# Patient Record
Sex: Female | Born: 1955 | Race: White | Hispanic: No | Smoking: Former smoker
Health system: Southern US, Community
[De-identification: ages and names within clinical notes are randomized; demographics above are authoritative.]

## PROBLEM LIST (undated history)

## (undated) DIAGNOSIS — J449 Chronic obstructive pulmonary disease, unspecified: Secondary | ICD-10-CM

## (undated) DIAGNOSIS — I10 Essential (primary) hypertension: Secondary | ICD-10-CM

## (undated) DIAGNOSIS — I509 Heart failure, unspecified: Secondary | ICD-10-CM

## (undated) DIAGNOSIS — E119 Type 2 diabetes mellitus without complications: Secondary | ICD-10-CM

## (undated) DIAGNOSIS — F32A Depression, unspecified: Secondary | ICD-10-CM

## (undated) HISTORY — DX: Chronic obstructive pulmonary disease, unspecified: J44.9

## (undated) HISTORY — DX: Essential (primary) hypertension: I10

## (undated) HISTORY — DX: Depression, unspecified: F32.A

## (undated) HISTORY — DX: Type 2 diabetes mellitus without complications: E11.9

## (undated) HISTORY — DX: Heart failure, unspecified: I50.9

---

## 2014-05-15 ENCOUNTER — Emergency Department: Payer: Self-pay | Admitting: Student

## 2015-03-16 ENCOUNTER — Ambulatory Visit: Payer: Self-pay

## 2015-03-23 ENCOUNTER — Ambulatory Visit: Payer: Self-pay | Attending: Oncology | Admitting: *Deleted

## 2015-03-23 ENCOUNTER — Other Ambulatory Visit: Payer: Self-pay | Admitting: Oncology

## 2015-03-23 ENCOUNTER — Encounter: Payer: Self-pay | Admitting: *Deleted

## 2015-03-23 ENCOUNTER — Ambulatory Visit
Admission: RE | Admit: 2015-03-23 | Discharge: 2015-03-23 | Disposition: A | Discharge status: Home / Self Care | Payer: Self-pay | Source: Ambulatory Visit | Attending: Oncology | Admitting: Oncology

## 2015-03-23 VITALS — BP 141/80 | HR 75 | Temp 98.3°F | Resp 18 | Ht 64.17 in | Wt 278.2 lb

## 2015-03-23 DIAGNOSIS — Z0000 Encounter for general adult medical examination without abnormal findings: Secondary | ICD-10-CM

## 2015-03-23 NOTE — Patient Instructions (Signed)
Gave patient hand-out, Women Staying Healthy, Active and Well from [facility], with education on breast health, pap smears, heart and colon health. 

## 2015-03-23 NOTE — Progress Notes (Signed)
Subjective:     Patient ID: Kristie CoveLinda Lou Sanchez, female   DOB: September 22, 1955, 60 y.o.   MRN: [id number]  HPI   Review of Systems     Objective:   Physical Exam  Pulmonary/Chest: Right breast exhibits no inverted nipple, no mass, no nipple discharge, no skin change and no tenderness. Left breast exhibits no inverted nipple, no mass, no nipple discharge, no skin change and no tenderness. Breasts are symmetrical.  Abdominal: There is no splenomegaly or hepatomegaly.  Genitourinary: Rectal exam shows no mass. No labial fusion. There is no rash, tenderness, lesion or injury on the right labia. There is no rash, tenderness, lesion or injury on the left labia. Uterus is not deviated, not enlarged, not fixed and not tender. Cervix exhibits no motion tenderness, no discharge and no friability. Right adnexum displays no mass and no fullness. Left adnexum displays no mass, no tenderness and no fullness. No erythema, tenderness or bleeding in the vagina. No foreign body around the vagina. No signs of injury around the vagina. No vaginal discharge found.       Assessment:     60 year [facility] White [facility] pap and mammogram.  Clinical breast exam unremarkable.  Taught self breast awareness.  Specimen collected for pap smear without difficulty.  Patient has been screened for eligibility.  She does not have any insurance, Medicare or Medicaid.  She also meets financial eligibility.  Hand-out given on the Affordable Care Act.    Plan:     Screening mammogram ordered.  Pap specimen sent to the lab.  Will follow-up per protocol.

## 2015-03-28 ENCOUNTER — Encounter: Payer: Self-pay | Admitting: *Deleted

## 2015-03-31 LAB — PAP LB AND HPV HIGH-RISK
HPV, HIGH-RISK: NEGATIVE
PAP Smear Comment: 0

## 2015-04-05 ENCOUNTER — Encounter: Payer: Self-pay | Admitting: *Deleted

## 2015-04-05 NOTE — Progress Notes (Signed)
Letter mailed to inform patient of her normal mammogram and pap smear.  Next pap due in 5 years, and mammogram in 1 year.  HSIS to [facility].

## 2017-07-31 ENCOUNTER — Ambulatory Visit: Payer: Self-pay | Attending: Oncology

## 2018-05-05 ENCOUNTER — Ambulatory Visit: Payer: Self-pay | Attending: Oncology | Admitting: *Deleted

## 2018-05-05 ENCOUNTER — Ambulatory Visit
Admission: RE | Admit: 2018-05-05 | Discharge: 2018-05-05 | Disposition: A | Discharge status: Home / Self Care | Payer: Self-pay | Source: Ambulatory Visit | Attending: Oncology | Admitting: Oncology

## 2018-05-05 ENCOUNTER — Encounter: Payer: Self-pay | Admitting: *Deleted

## 2018-05-05 VITALS — BP 136/78 | HR 75 | Temp 98.3°F | Ht 64.0 in | Wt 292.6 lb

## 2018-05-05 DIAGNOSIS — Z0000 Encounter for general adult medical examination without abnormal findings: Secondary | ICD-10-CM | POA: Insufficient documentation

## 2018-05-05 NOTE — Patient Instructions (Signed)
Gave patient hand-out, Women Staying Healthy, Active and Well from [facility], with education on breast health, pap smears, heart and colon health. 

## 2018-05-05 NOTE — Progress Notes (Signed)
  Subjective:     Patient ID: [name], female   DOB: 09-26-1955, 63 y.o.   MRN: [id number]  HPI   Review of Systems     Objective:   Physical Exam Chest:     Breasts:        Right: No swelling, bleeding, inverted nipple, mass, nipple discharge, skin change or tenderness.        Left: No swelling, bleeding, inverted nipple, mass, nipple discharge, skin change or tenderness.  Lymphadenopathy:     Upper Body:     Right upper body: No supraclavicular or axillary adenopathy.     Left upper body: No supraclavicular or axillary adenopathy.        Assessment:     63 year old White female returns to [facility] for annual screening.  Clinical breast exam unremarkable.  Taught self breast awareness.  Last pap on 03/31/15 was negative / negative.  Next pap due in 2022.  Patient has been screened for eligibility.  She does not have any insurance, Medicare or Medicaid.  She also meets financial eligibility.  Hand-out given on the Affordable Care Act.  Risk Assessment    Risk Scores      05/05/2018   Last edited by: Alta Corning, CMA   5-year risk: 1.1 %   Lifetime risk: 4.9 %            Plan:     Screening mammogram ordered.  Will follow-up per [facility] protocol.

## 2018-05-07 ENCOUNTER — Encounter: Payer: Self-pay | Admitting: *Deleted

## 2018-05-07 NOTE — Progress Notes (Signed)
Letter mailed from [facility] to inform patient of her normal mammogram results.  Patient is to follow-up with annual screening in one year.  HSIS to [facility]. 

## 2019-10-20 IMAGING — MG DIGITAL SCREENING BILATERAL MAMMOGRAM WITH TOMO AND CAD
5 series · 6 of 9 positions shown · non-contrast
Comparison: Previous exam(s).

ACR Breast Density Category a: The breast tissue is almost entirely
fatty.

CLINICAL DATA: Screening.

EXAM:
DIGITAL SCREENING BILATERAL MAMMOGRAM WITH TOMO AND CAD

[L CC synth-2D]
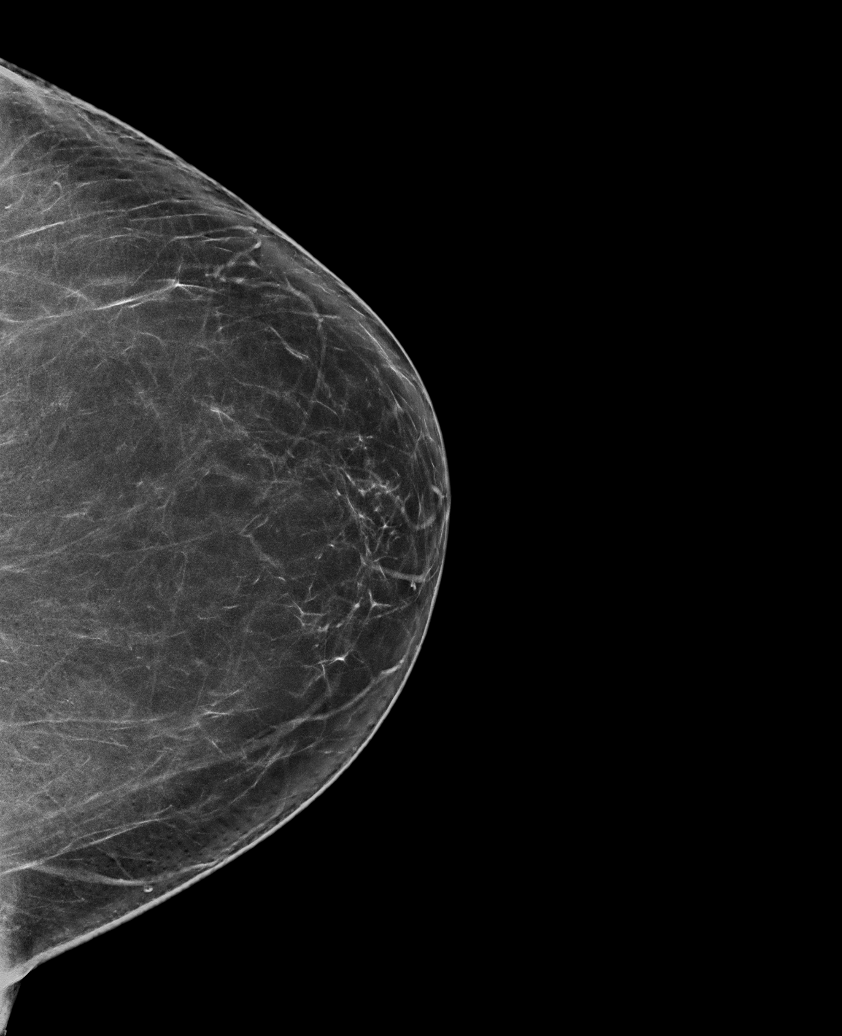

[R CC synth-2D]
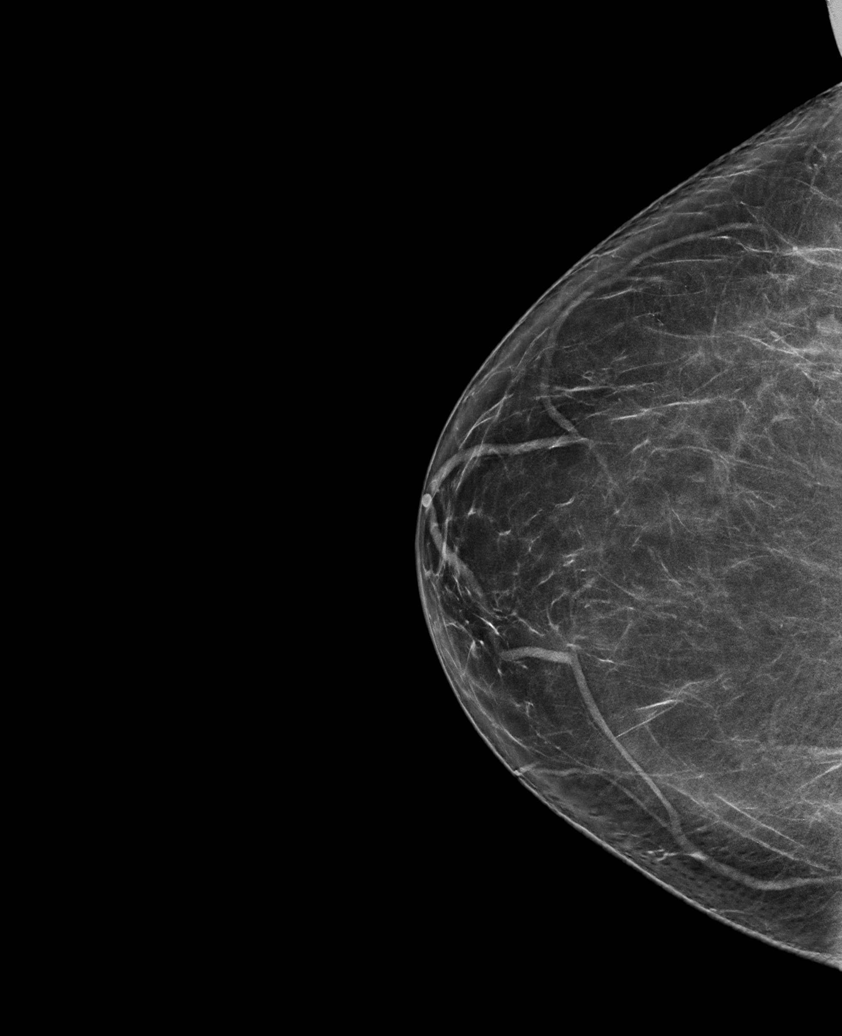

[R MLO synth-2D]
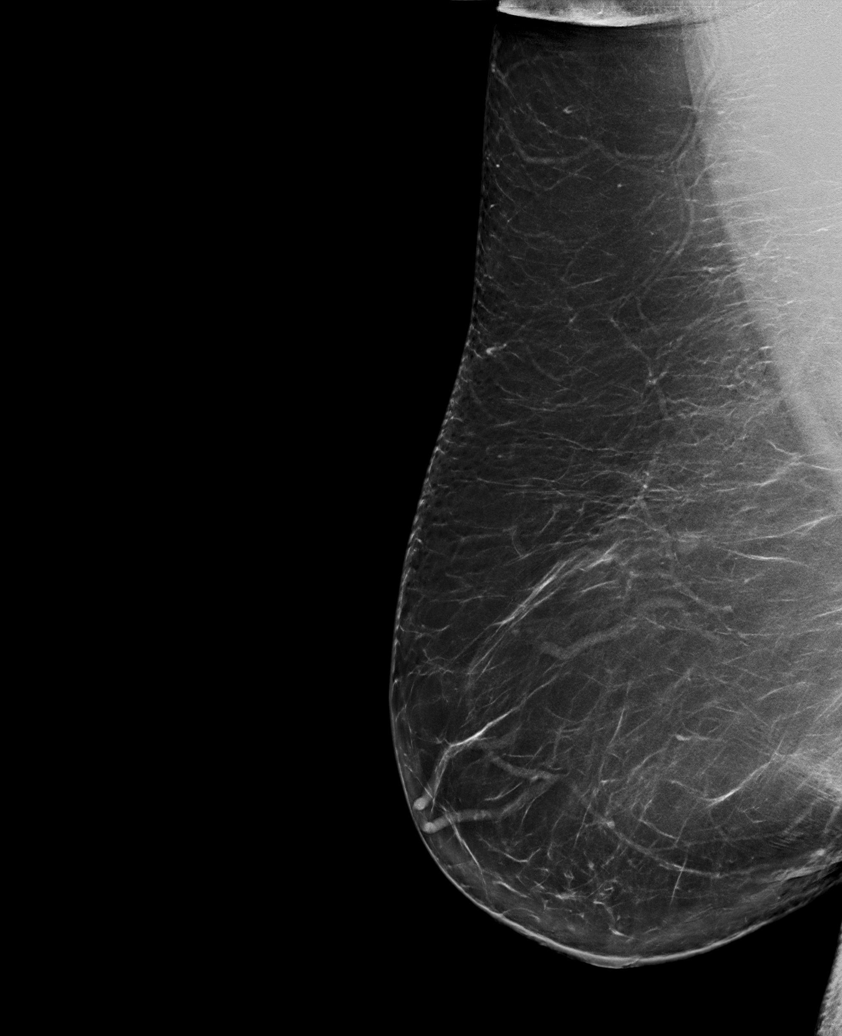

[L MLO synth-2D]
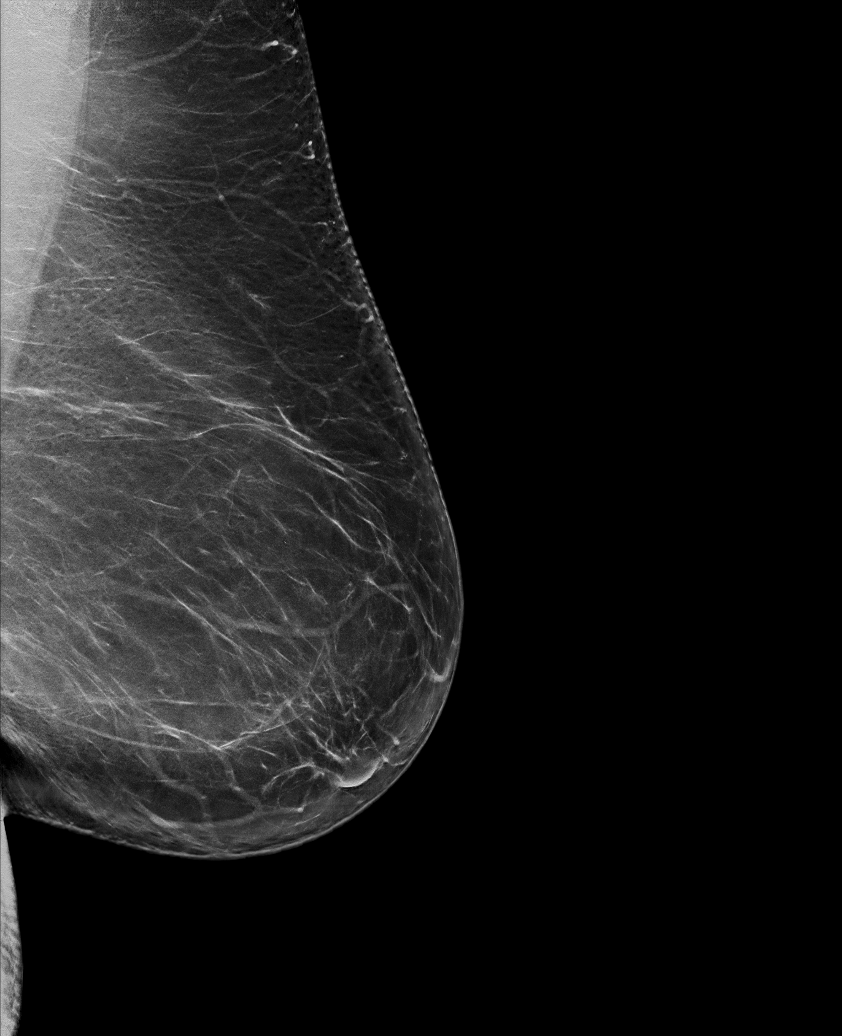

[R CC tomo · 2 of 72 frames shown]
[frame 24/72]
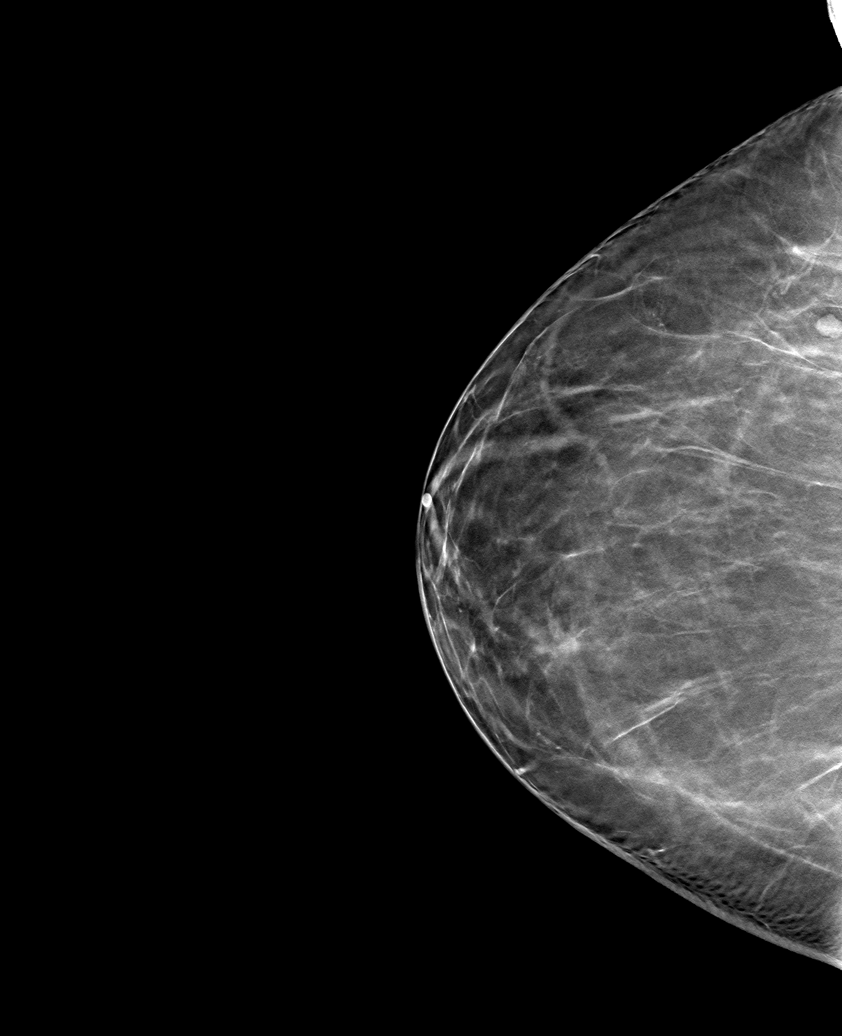
[frame 37/72]
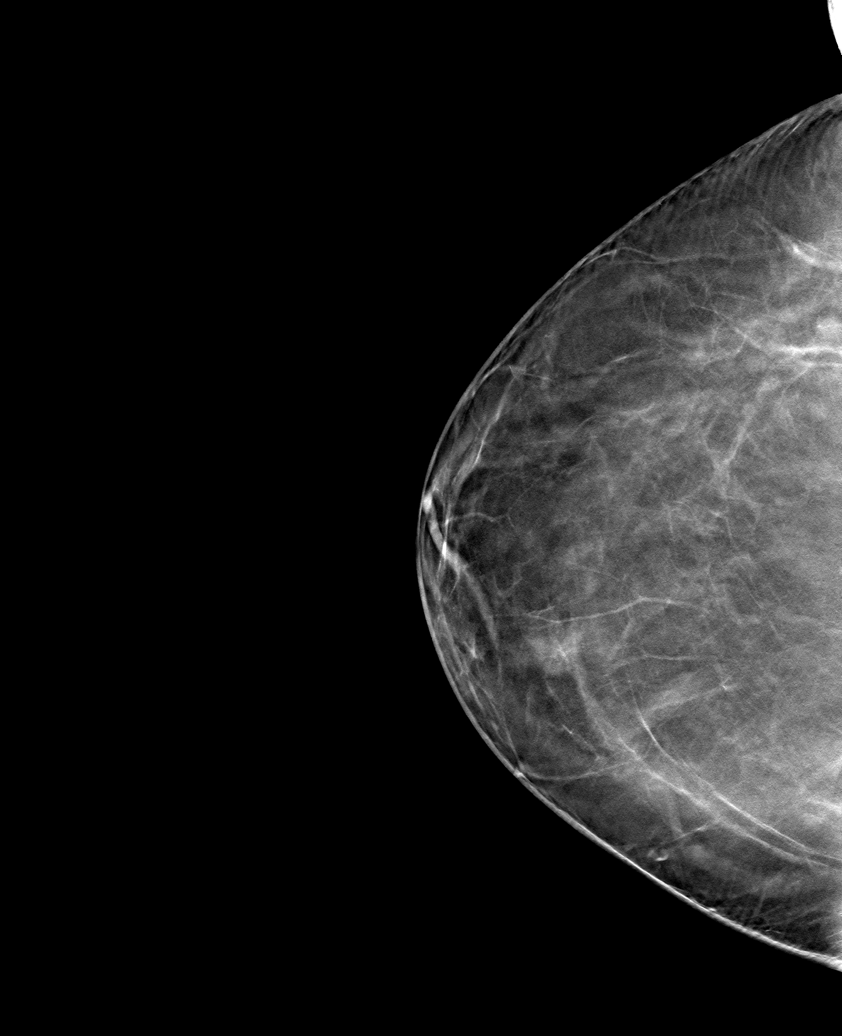

[6 of 9 positions shown; findings below may reference images not displayed]

FINDINGS: There are no findings suspicious for malignancy. Images were
processed with CAD.
IMPRESSION: No mammographic evidence of malignancy. A result letter of this
screening mammogram will be mailed directly to the patient.

RECOMMENDATION:
Screening mammogram in one year. (Code:[id number]-[id number])

BI-RADS CATEGORY  1: Negative.

## 2021-07-19 ENCOUNTER — Other Ambulatory Visit: Payer: Self-pay | Admitting: Family Medicine

## 2021-07-19 DIAGNOSIS — Z1231 Encounter for screening mammogram for malignant neoplasm of breast: Secondary | ICD-10-CM

## 2022-02-10 ENCOUNTER — Inpatient Hospital Stay
Admission: EM | Admit: 2022-02-10 | Discharge: 2022-02-12 | DRG: 291 | Disposition: A | Discharge status: Home / Self Care | Payer: Medicare Other | Attending: Internal Medicine | Admitting: Internal Medicine

## 2022-02-10 ENCOUNTER — Emergency Department: Payer: Medicare Other

## 2022-02-10 ENCOUNTER — Other Ambulatory Visit: Payer: Self-pay

## 2022-02-10 DIAGNOSIS — J209 Acute bronchitis, unspecified: Secondary | ICD-10-CM | POA: Diagnosis present

## 2022-02-10 DIAGNOSIS — Z87891 Personal history of nicotine dependence: Secondary | ICD-10-CM | POA: Diagnosis not present

## 2022-02-10 DIAGNOSIS — Z79899 Other long term (current) drug therapy: Secondary | ICD-10-CM

## 2022-02-10 DIAGNOSIS — I509 Heart failure, unspecified: Secondary | ICD-10-CM | POA: Diagnosis not present

## 2022-02-10 DIAGNOSIS — Z20822 Contact with and (suspected) exposure to covid-19: Secondary | ICD-10-CM | POA: Diagnosis present

## 2022-02-10 DIAGNOSIS — J9601 Acute respiratory failure with hypoxia: Secondary | ICD-10-CM | POA: Diagnosis present

## 2022-02-10 DIAGNOSIS — I5031 Acute diastolic (congestive) heart failure: Secondary | ICD-10-CM | POA: Diagnosis present

## 2022-02-10 DIAGNOSIS — I110 Hypertensive heart disease with heart failure: Principal | ICD-10-CM | POA: Diagnosis present

## 2022-02-10 DIAGNOSIS — E1165 Type 2 diabetes mellitus with hyperglycemia: Secondary | ICD-10-CM | POA: Diagnosis present

## 2022-02-10 DIAGNOSIS — Z882 Allergy status to sulfonamides status: Secondary | ICD-10-CM | POA: Diagnosis not present

## 2022-02-10 DIAGNOSIS — Z6841 Body Mass Index (BMI) 40.0 and over, adult: Secondary | ICD-10-CM

## 2022-02-10 DIAGNOSIS — I10 Essential (primary) hypertension: Secondary | ICD-10-CM | POA: Insufficient documentation

## 2022-02-10 DIAGNOSIS — R0602 Shortness of breath: Secondary | ICD-10-CM

## 2022-02-10 DIAGNOSIS — E119 Type 2 diabetes mellitus without complications: Secondary | ICD-10-CM

## 2022-02-10 DIAGNOSIS — F32A Depression, unspecified: Secondary | ICD-10-CM | POA: Insufficient documentation

## 2022-02-10 DIAGNOSIS — F17200 Nicotine dependence, unspecified, uncomplicated: Secondary | ICD-10-CM | POA: Insufficient documentation

## 2022-02-10 DIAGNOSIS — J441 Chronic obstructive pulmonary disease with (acute) exacerbation: Secondary | ICD-10-CM | POA: Diagnosis present

## 2022-02-10 DIAGNOSIS — R0902 Hypoxemia: Principal | ICD-10-CM

## 2022-02-10 DIAGNOSIS — E66813 Obesity, class 3: Secondary | ICD-10-CM | POA: Insufficient documentation

## 2022-02-10 LAB — RESP PANEL BY RT-PCR (RSV, FLU A&B, COVID)  RVPGX2
Influenza A by PCR: NEGATIVE
Influenza B by PCR: NEGATIVE
Resp Syncytial Virus by PCR: NEGATIVE
SARS Coronavirus 2 by RT PCR: NEGATIVE

## 2022-02-10 LAB — BRAIN NATRIURETIC PEPTIDE: B Natriuretic Peptide: 49.8 pg/mL (ref 0.0–100.0)

## 2022-02-10 LAB — CBC WITH DIFFERENTIAL/PLATELET
Abs Immature Granulocytes: 0.06 10*3/uL (ref 0.00–0.07)
Basophils Absolute: 0 10*3/uL (ref 0.0–0.1)
Basophils Relative: 0 %
Eosinophils Absolute: 0.1 10*3/uL (ref 0.0–0.5)
Eosinophils Relative: 1 %
HCT: 42.8 % (ref 36.0–46.0)
Hemoglobin: 13.2 g/dL (ref 12.0–15.0)
Immature Granulocytes: 1 %
Lymphocytes Relative: 16 %
Lymphs Abs: 1.8 10*3/uL (ref 0.7–4.0)
MCH: 28.1 pg (ref 26.0–34.0)
MCHC: 30.8 g/dL (ref 30.0–36.0)
MCV: 91.3 fL (ref 80.0–100.0)
Monocytes Absolute: 0.7 10*3/uL (ref 0.1–1.0)
Monocytes Relative: 7 %
Neutro Abs: 8.7 10*3/uL — ABNORMAL HIGH (ref 1.7–7.7)
Neutrophils Relative %: 75 %
Platelets: 187 10*3/uL (ref 150–400)
RBC: 4.69 MIL/uL (ref 3.87–5.11)
RDW: 14.3 % (ref 11.5–15.5)
WBC: 11.4 10*3/uL — ABNORMAL HIGH (ref 4.0–10.5)
nRBC: 0 % (ref 0.0–0.2)

## 2022-02-10 LAB — COMPREHENSIVE METABOLIC PANEL
ALT: 17 U/L (ref 0–44)
AST: 14 U/L — ABNORMAL LOW (ref 15–41)
Albumin: 3.3 g/dL — ABNORMAL LOW (ref 3.5–5.0)
Alkaline Phosphatase: 62 U/L (ref 38–126)
Anion gap: 6 (ref 5–15)
BUN: 10 mg/dL (ref 8–23)
CO2: 33 mmol/L — ABNORMAL HIGH (ref 22–32)
Calcium: 9.1 mg/dL (ref 8.9–10.3)
Chloride: 102 mmol/L (ref 98–111)
Creatinine, Ser: 0.66 mg/dL (ref 0.44–1.00)
GFR, Estimated: >60 mL/min (ref >60)
Glucose, Bld: 170 mg/dL — ABNORMAL HIGH (ref 70–99)
Potassium: 3.9 mmol/L (ref 3.5–5.1)
Sodium: 141 mmol/L (ref 135–145)
Total Bilirubin: 0.6 mg/dL (ref 0.3–1.2)
Total Protein: 7.4 g/dL (ref 6.5–8.1)

## 2022-02-10 LAB — PHOSPHORUS: Phosphorus: 3.1 mg/dL (ref 2.5–4.6)

## 2022-02-10 LAB — PROCALCITONIN: Procalcitonin: <0.1 ng/mL

## 2022-02-10 LAB — MAGNESIUM: Magnesium: 2.2 mg/dL (ref 1.7–2.4)

## 2022-02-10 MED ORDER — VENLAFAXINE HCL ER 75 MG PO CP24
150.0000 mg | ORAL_CAPSULE | Freq: Every day | ORAL | Status: DC
  Administered 2022-02-11 – 2022-02-12 (×2): 150 mg via ORAL
  Filled 2022-02-10: qty 1
  Filled 2022-02-10: qty 2
  Filled 2022-02-10: qty 1

## 2022-02-10 MED ORDER — ACETAMINOPHEN 650 MG RE SUPP: 650.0000 mg | Freq: Four times a day (QID) | RECTAL | Status: DC | PRN

## 2022-02-10 MED ORDER — SENNOSIDES-DOCUSATE SODIUM 8.6-50 MG PO TABS: 1.0000 | ORAL_TABLET | Freq: Every evening | ORAL | Status: DC | PRN

## 2022-02-10 MED ORDER — ENOXAPARIN SODIUM 40 MG/0.4ML IJ SOSY: 40.0000 mg | PREFILLED_SYRINGE | INTRAMUSCULAR | Status: DC

## 2022-02-10 MED ORDER — ONDANSETRON HCL 4 MG PO TABS: 4.0000 mg | ORAL_TABLET | Freq: Four times a day (QID) | ORAL | Status: DC | PRN

## 2022-02-10 MED ORDER — LISINOPRIL 5 MG PO TABS
5.0000 mg | ORAL_TABLET | Freq: Two times a day (BID) | ORAL | Status: DC
  Administered 2022-02-10 – 2022-02-12 (×4): 5 mg via ORAL
  Filled 2022-02-10 (×4): qty 1

## 2022-02-10 MED ORDER — ACETAMINOPHEN 325 MG PO TABS: 650.0000 mg | ORAL_TABLET | Freq: Four times a day (QID) | ORAL | Status: DC | PRN

## 2022-02-10 MED ORDER — HYDROCHLOROTHIAZIDE 12.5 MG PO TABS
12.5000 mg | ORAL_TABLET | Freq: Every day | ORAL | Status: DC
  Filled 2022-02-10: qty 1

## 2022-02-10 MED ORDER — NICOTINE 21 MG/24HR TD PT24: 21.0000 mg | MEDICATED_PATCH | Freq: Every day | TRANSDERMAL | Status: DC | PRN

## 2022-02-10 MED ORDER — LABETALOL HCL 5 MG/ML IV SOLN: 5.0000 mg | INTRAVENOUS | Status: DC | PRN

## 2022-02-10 MED ORDER — ONDANSETRON HCL 4 MG/2ML IJ SOLN: 4.0000 mg | Freq: Four times a day (QID) | INTRAMUSCULAR | Status: DC | PRN

## 2022-02-10 MED ORDER — FUROSEMIDE 10 MG/ML IJ SOLN
20.0000 mg | Freq: Once | INTRAMUSCULAR | Status: AC
  Administered 2022-02-10: 20 mg via INTRAVENOUS
  Filled 2022-02-10: qty 4

## 2022-02-10 MED ORDER — FUROSEMIDE 10 MG/ML IJ SOLN
20.0000 mg | Freq: Two times a day (BID) | INTRAMUSCULAR | Status: DC
  Administered 2022-02-11: 20 mg via INTRAVENOUS
  Filled 2022-02-10: qty 4

## 2022-02-10 MED ORDER — ENOXAPARIN SODIUM 80 MG/0.8ML IJ SOSY
0.5000 mg/kg | PREFILLED_SYRINGE | INTRAMUSCULAR | Status: DC
  Administered 2022-02-10 – 2022-02-11 (×2): 65 mg via SUBCUTANEOUS
  Filled 2022-02-10: qty 0.65
  Filled 2022-02-10: qty 0.8
  Filled 2022-02-10: qty 0.65

## 2022-02-10 NOTE — ED Notes (Signed)
Pt to ED for SOB since 1 week. Pitting edema noted to both ankles. Currently on 2L oxygen, which was placed in triage. Respirations appear unlabored.

## 2022-02-10 NOTE — Assessment & Plan Note (Addendum)
-   Resumed home lisinopril 5 mg in the morning and in the afternoon, hydrochlorothiazide 12.5 mg daily - Labetalol 5 mg IV every 3 hours as needed for SBP greater than 175, 4 doses ordered

## 2022-02-10 NOTE — Hospital Course (Signed)
[name] is a 66 year old female with obesity, hypertension, nicotine use, who presents emergency department for chief concerns of 1 week of worsening shortness of breath.  Initial vitals in the emergency department showed temperature 98.3, respiration rate of 22, heart rate of 95, blood pressure 155/79, SpO2 of 90% on room air.  Serum sodium is 141, potassium 3.9, chloride 102, bicarb 33, BUN of 10, serum creatinine of 0.66, EGFR greater than 60, nonfasting blood glucose 170, WBC 11.4, hemoglobin 13.2, platelets of 187.  BNP is 49.8.  COVID/influenza A/influenza B PCR were negative.  RSV was negative.  Per ED report, at outpatient urgent care center at patient's primary care office, patient had SpO2 of 89% on room air and patient was advised to come to the emergency department for further evaluation.  ED treatment: Furosemide 20 mg IV one-time dose.

## 2022-02-10 NOTE — Assessment & Plan Note (Signed)
-   Resumed home venlafaxine 150 mg daily with breakfast

## 2022-02-10 NOTE — ED Triage Notes (Signed)
Pt presents to the ED via POV due to SOB. Pt has been feeling well x1 week. Pt was seen at that doctor's office earlier today and told to come to ER due to low O2 levels 89%. Pt is 90% on RA. Pt placed on 2L in triage. Husband bedside. Pt A&Ox4. Pt does not appear in distress at this moment.

## 2022-02-10 NOTE — Assessment & Plan Note (Addendum)
Worsening dyspnea especially with exertion - Check procalcitonin - Strict I's and O's - Complete echo ordered - Patient is status post furosemide 20 mg IV per EDP - Furosemide 20 mg IV twice daily starting on 02/11/2022, 2 doses ordered - Telemetry cardiac, inpatient

## 2022-02-10 NOTE — ED Provider Triage Note (Signed)
Emergency Medicine Provider Triage Evaluation Note  Kristie Sanchez , a 66 y.o. female  was evaluated in triage.  Pt complains of difficulty breathing, cough for over a week, complaining of some upper back pain.  Review of Systems  Positive:  Negative:   Physical Exam  BP (!) 155/79 (BP Location: Left Wrist)   Pulse 95   Temp 98.9 F (37.2 C) (Oral)   Resp (!) 22   SpO2 90%  Gen:   Awake, no distress   Resp:  Normal effort  MSK:   Moves extremities without difficulty  Other:    Medical Decision Making  Medically screening exam initiated at 11:40 AM.  Appropriate orders placed.  Kristie Sanchez was informed that the remainder of the evaluation will be completed by another provider, this initial triage assessment does not replace that evaluation, and the importance of remaining in the ED until their evaluation is complete.     Faythe Ghee, PA-C 02/10/22 1140

## 2022-02-10 NOTE — Progress Notes (Signed)
PHARMACIST - PHYSICIAN COMMUNICATION  CONCERNING:  Enoxaparin (Lovenox) for DVT Prophylaxis    RECOMMENDATION: Patient was prescribed enoxaprin 40mg  q24 hours for VTE prophylaxis.   Filed Weights   02/10/22 1700 02/10/22 1702  Weight: (!) 136.5 kg (300 lb 14.9 oz) 131.5 kg (289 lb 12.8 oz)    Body mass index is 51.34 kg/m.  Estimated Creatinine Clearance: 90.5 mL/min (by C-G formula based on SCr of 0.66 mg/dL).   Based on [facility] policy patient is candidate for enoxaparin 0.5mg /kg TBW SQ every 24 hours based on BMI being >30.  DESCRIPTION: Pharmacy has adjusted enoxaparin dose per [facility] policy.  Patient is now receiving enoxaparin 65 mg every 24 hours    [facility], PharmD Clinical Pharmacist  02/10/2022 5:06 PM

## 2022-02-10 NOTE — ED Notes (Signed)
Called lab. They will run new labs except HIV off previous specimen.

## 2022-02-10 NOTE — Assessment & Plan Note (Addendum)
-   This complicates overall care and prognosis.  - Counseled patient on healthy weight loss

## 2022-02-10 NOTE — H&P (Signed)
History and Physical   Kristie Sanchez NUU:[id number] DOB: April 09, 1955 DOA: 02/10/2022  PCP: Gennette Pac, FNP  Patient coming from: [facility] via [place]  I have personally briefly reviewed patient's old medical records in [place].  Chief Concern: Shortness of breath  HPI: [name] is a 66 year old female with obesity, hypertension, nicotine use, who presents emergency department for chief concerns of 1 week of worsening shortness of breath.  Initial vitals in the emergency department showed temperature 98.3, respiration rate of 22, heart rate of 95, blood pressure 155/79, SpO2 of 90% on room air.  Serum sodium is 141, potassium 3.9, chloride 102, bicarb 33, BUN of 10, serum creatinine of 0.66, EGFR greater than 60, nonfasting blood glucose 170, WBC 11.4, hemoglobin 13.2, platelets of 187.  BNP is 49.8.  COVID/influenza A/influenza B PCR were negative.  RSV was negative.  Per ED report, at outpatient urgent care center at patient's primary care office, patient had SpO2 of 89% on room air and patient was advised to come to the emergency department for further evaluation.  ED treatment: Furosemide 20 mg IV one-time dose.  At bedside, she is able to tell me her name, age, current year.  She reports shortness of breath that is worse with exertion for over 6 months. She states that the symptoms have gotten worse over the last week. She has had to increase the incline of her bed at home. She further reports increase productive cough over the past one week.  She reports that there is increased sputum production and it is dark thick brown.  She denies fever, trauma to her person, chest pain, abdominal pain, dysuria, hematuria.  She endorses 2 episodes of loose bowel movements on 02/09/2022.  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  Social history: She lives at home with her son, Kristie Sanchez. She vapes nicotine daily. She denies etoh  and recreational drug use. 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  ROS: Constitutional: no weight change, no fever ENT/Mouth: no sore throat, no rhinorrhea Eyes: no eye pain, no vision changes Cardiovascular: no chest pain, + dyspnea,  no edema, no palpitations Respiratory: + cough, no sputum, no wheezing Gastrointestinal: no nausea, no vomiting, no diarrhea, no constipation Genitourinary: no urinary incontinence, no dysuria, no hematuria Musculoskeletal: no arthralgias, no myalgias Skin: no skin lesions, no pruritus, Neuro: + weakness, no loss of consciousness, no syncope Psych: no anxiety, no depression, + decrease appetite Heme/Lymph: no bruising, no bleeding  ED Course: Discussed with emergency medicine provider, patient requiring hospitalization for chief concerns of new onset heart failure.  Assessment/Plan  Principal Problem:   New onset of congestive heart failure (HCC) Active Problems:   Essential hypertension   Depression   Nicotine dependence   Morbid obesity with body mass index (BMI) of 50.0 to 59.9 in adult [facility])   Assessment and Plan:  * New onset of congestive heart failure (HCC) Worsening dyspnea especially with exertion - Check procalcitonin - Strict I's and O's - Complete echo ordered - Patient is status post furosemide 20 mg IV per EDP - Furosemide 20 mg IV twice daily starting on 02/11/2022, 2 doses ordered - Telemetry cardiac, inpatient  Morbid obesity with body mass index (BMI) of 50.0 to 59.9 in adult [facility]) - This complicates overall care and prognosis.  - Counseled patient on healthy weight loss  Nicotine dependence - Patient vapes nicotine daily -  Nicotine patch as needed ordered - Counseling given on vaping cessation including risk of acute lung injury, pneumothorax, death - I stated that she can consider Nicorette gum versus nicotine patch - She can 1-800-QUIT-NOW to get the nicotine patch sent to her - She  endorses understanding and compliance  Depression - Resumed home venlafaxine 150 mg daily with breakfast  Essential hypertension - Resumed home lisinopril 5 mg in the morning and in the afternoon, hydrochlorothiazide 12.5 mg daily - Labetalol 5 mg IV every 3 hours as needed for SBP greater than 175, 4 doses ordered  Chart reviewed.   DVT prophylaxis: Enoxaparin Code Status: full code Diet: Heart healthy Family Communication: Updated Kristie Sanchez, son at bedside, with patient's permission Disposition Plan: Pending clinical course Consults called: None at this time Admission status: Inpatient, telemetry cardiac  No past medical history on file.  No past surgical history on file.  Social History:  reports that she has quit smoking. Her smoking use included cigarettes. She has a 35.00 pack-year smoking history. She quit smokeless tobacco use about 10 years ago. She reports that she does not drink alcohol and does not use drugs.  Allergies  Allergen Reactions   Sulfa Antibiotics Swelling   Family History  Problem Relation Age of Onset   Lymphoma Mother 27   Lymphoma Maternal Uncle    Lymphoma Maternal Aunt    Breast cancer Neg Hx    Family history: Family history reviewed and not pertinent.  Prior to Admission medications   Medication Sig Start Date End Date Taking? Authorizing Provider  lisinopril (ZESTRIL) 5 MG tablet Take 5 mg by mouth daily. 09/15/21  Yes [provider]  venlafaxine XR (EFFEXOR-XR) 37.5 MG 24 hr capsule Take 37.5 mg by mouth daily with breakfast. 01/18/22 01/18/23 Yes [provider]   Physical Exam: Vitals:   02/10/22 1600 02/10/22 1615 02/10/22 1700 02/10/22 1702  BP: (!) 140/82     Pulse: 67 69    Resp: 13 15    Temp:      TempSrc:      SpO2: 95% 96%    Weight:   (!) 136.5 kg 131.5 kg  Height:   _0  (1.626 m) _1  (1.6 m)   Constitutional: appears age-appropriate, NAD, calm, comfortable Eyes: PERRL, lids and conjunctivae normal ENMT:  Mucous membranes are moist. Posterior pharynx clear of any exudate or lesions. Age-appropriate dentition. Hearing appropriate Neck: normal, supple, no masses, no thyromegaly Respiratory: Generalized decreased lung sounds on auscultation bilaterally, no wheezing, no crackles. Normal respiratory effort. No accessory muscle use.  Cardiovascular: Regular rate and rhythm, no murmurs / rubs / gallops.  Bilateral lower extremity 1+ edema. 2+ pedal pulses. No carotid bruits.  Abdomen: Morbidly obese abdomen, no tenderness, no masses palpated, no hepatosplenomegaly. Bowel sounds positive.  Musculoskeletal: no clubbing / cyanosis. No joint deformity upper and lower extremities. Good ROM, no contractures, no atrophy. Normal muscle tone.  Skin: no rashes, lesions, ulcers. No induration Neurologic: Sensation intact. Strength 5/5 in all 4.  Psychiatric: Normal judgment and insight. Alert and oriented x 3. Normal mood.   EKG: independently reviewed, showing sinus rhythm with rate of 90, QTc 401  Chest x-ray on Admission: I personally reviewed and I agree with radiologist reading as below.  DG Chest 2 View  Result Date: 02/10/2022 CLINICAL DATA:  Shortness of breath and cough for 1 week. EXAM: CHEST - 2 VIEW COMPARISON:  None Available. FINDINGS: The mediastinal contour is normal. Heart size is upper limits  of normal. Mild pulmonary edema is identified. There is no focal pneumonia or pleural effusion. The visualized skeletal structures are unremarkable. IMPRESSION: Mild pulmonary edema. Electronically Signed   By: Abelardo Diesel M.D.   On: 02/10/2022 12:06    Labs on Admission: I have personally reviewed following labs  CBC: Recent Labs  Lab 02/10/22 1409  WBC 11.4*  NEUTROABS 8.7*  HGB 13.2  HCT 42.8  MCV 91.3  PLT 561   Basic Metabolic Panel: Recent Labs  Lab 02/10/22 1408 02/10/22 1409  NA  --  141  K  --  3.9  CL  --  102  CO2  --  33*  GLUCOSE  --  170*  BUN  --  10  CREATININE  --   0.66  CALCIUM  --  9.1  MG 2.2  --   PHOS 3.1  --    GFR: Estimated Creatinine Clearance: 90.5 mL/min (by C-G formula based on SCr of 0.66 mg/dL).  Liver Function Tests: Recent Labs  Lab 02/10/22 1409  AST 14*  ALT 17  ALKPHOS 62  BILITOT 0.6  PROT 7.4  ALBUMIN 3.3*   Dr. Tobie Poet [facility]  If 7PM-7AM, please contact overnight-coverage provider If 7AM-7PM, please contact day coverage provider [email or web address]  02/10/2022, 6:07 PM

## 2022-02-10 NOTE — ED Provider Notes (Addendum)
[facility] Provider Note    Event Date/Time   First MD Initiated Contact with Patient 02/10/22 1528     (approximate)   History   Shortness of Breath   HPI  Kristie Sanchez is a 66 y.o. female past medical history significant for hypertension, nicotine use, obesity, presents to the emergency department shortness of breath.  Endorses 1 week of progressively worsening shortness of breath.  Shortness of breath is worse at nighttime when laying down.  States that she starts coughing and feels like she cannot catch her breath when lying down at bedtime.  Swelling to her lower legs.  Denies fever or chills.  No chest pain.  Cardiac catheterization in 2004 that showed no signs of coronary artery disease.  Denies any chest pain.  No history of DVT or PE.  Works as a Midwife.     Physical Exam   Triage Vital Signs: ED Triage Vitals  Enc Vitals Group     BP 02/10/22 1137 (!) 155/79     Pulse Rate 02/10/22 1137 95     Resp 02/10/22 1137 (!) 22     Temp 02/10/22 1137 98.9 F (37.2 C)     Temp Source 02/10/22 1137 Oral     SpO2 02/10/22 1137 90 %     Weight --      Height --      Head Circumference --      Peak Flow --      Pain Score 02/10/22 1138 1     Pain Loc --      Pain Edu? --      Excl. in GC? --     Most recent vital signs: Vitals:   02/10/22 1600 02/10/22 1615  BP: (!) 140/82   Pulse: 67 69  Resp: 13 15  Temp:    SpO2: 95% 96%    Physical Exam Constitutional:      Appearance: She is well-developed. She is obese.  HENT:     Head: Atraumatic.  Eyes:     Conjunctiva/sclera: Conjunctivae normal.  Cardiovascular:     Rate and Rhythm: Regular rhythm.     Heart sounds: Murmur heard.     Gallop present.  Pulmonary:     Effort: No respiratory distress.     Comments: 88% on room air, placed on 2 L nasal cannula.  Tachypneic.  Lungs clear to auscultation.  No wheezing. Abdominal:     General: There is no distension.  Musculoskeletal:         General: Normal range of motion.     Cervical back: Normal range of motion.     Right lower leg: Edema present.     Left lower leg: Edema present.     Comments: Trace pitting edema to bilateral lower extremities  Skin:    General: Skin is warm.  Neurological:     Mental Status: She is alert. Mental status is at baseline.          IMPRESSION / MDM / ASSESSMENT AND PLAN / ED COURSE  I reviewed the triage vital signs and the nursing notes.  Differential diagnosis including COPD exacerbation, pneumonia, viral illness, pulmonary embolism, ACS, CHF, malignancy  On chart review patient was evaluated earlier today and was sent to the emergency department for hypoxia to 88%.   EKG  I, Corena Herter, the attending physician, personally viewed and interpreted this ECG.   Rate: Normal  Rhythm: Normal sinus  Axis: Normal  Intervals: Normal  ST&T Change: None  No tachycardic or bradycardic dysrhythmias while on cardiac telemetry.  RADIOLOGY I independently reviewed imaging, my interpretation of imaging: Chest x-ray showed trace pulmonary edema.  Read as pulmonary edema      ED Results / Procedures / Treatments   Labs (all labs ordered are listed, but only abnormal results are displayed) Labs interpreted as -  Chronically elevated bicarb.  No significant leukocytosis or anemia.  COVID and influenza testing negative.  Normal BNP.  Labs Reviewed  COMPREHENSIVE METABOLIC PANEL - Abnormal; Notable for the following components:      Result Value   CO2 33 (*)    Glucose, Bld 170 (*)    Albumin 3.3 (*)    AST 14 (*)    All other components within normal limits  CBC WITH DIFFERENTIAL/PLATELET - Abnormal; Notable for the following components:   WBC 11.4 (*)    Neutro Abs 8.7 (*)    All other components within normal limits  RESP PANEL BY RT-PCR (RSV, FLU A&B, COVID)  RVPGX2  BRAIN NATRIURETIC PEPTIDE  PHOSPHORUS  MAGNESIUM  PROCALCITONIN  HIV ANTIBODY (ROUTINE  TESTING W REFLEX)  BASIC METABOLIC PANEL  CBC  PROCALCITONIN     Clinical picture [place] for acute hypoxic respiratory failure in the setting of new onset heart failure.  Given a dose of IV Lasix.  Consulted hospitalist for evaluation/admission  PROCEDURES:  Critical Care performed: No  Procedures  Patient's presentation is most consistent with acute presentation with potential threat to life or bodily function.   MEDICATIONS ORDERED IN ED: Medications  acetaminophen (TYLENOL) tablet 650 mg (has no administration in time range)    Or  acetaminophen (TYLENOL) suppository 650 mg (has no administration in time range)  ondansetron (ZOFRAN) tablet 4 mg (has no administration in time range)    Or  ondansetron (ZOFRAN) injection 4 mg (has no administration in time range)  senna-docusate (Senokot-S) tablet 1 tablet (has no administration in time range)  furosemide (LASIX) injection 20 mg (has no administration in time range)  labetalol (NORMODYNE) injection 5 mg (has no administration in time range)  nicotine (NICODERM CQ - dosed in mg/24 hours) patch 21 mg (has no administration in time range)  enoxaparin (LOVENOX) injection 65 mg (has no administration in time range)  furosemide (LASIX) injection 20 mg (20 mg Intravenous Given 02/10/22 1642)    FINAL CLINICAL IMPRESSION(S) / ED DIAGNOSES   Final diagnoses:  Hypoxia  SOB (shortness of breath)  Congestive heart failure, unspecified HF chronicity, unspecified heart failure type (HCC)     Rx / DC Orders   ED Discharge Orders     None        Note:  This document was prepared using Dragon voice recognition software and may include unintentional dictation errors.   Corena Herter, MD 02/10/22 3474    Corena Herter, MD 02/10/22 1756

## 2022-02-10 NOTE — Assessment & Plan Note (Addendum)
-   Patient vapes nicotine daily - Nicotine patch as needed ordered - Counseling given on vaping cessation including risk of acute lung injury, pneumothorax, death - I stated that she can consider Nicorette gum versus nicotine patch - She can 1-800-QUIT-NOW to get the nicotine patch sent to her - She endorses understanding and compliance

## 2022-02-11 ENCOUNTER — Inpatient Hospital Stay
Admit: 2022-02-11 | Discharge: 2022-02-11 | Disposition: A | Discharge status: Home / Self Care | Payer: Medicare Other | Attending: Internal Medicine | Admitting: Internal Medicine

## 2022-02-11 DIAGNOSIS — I509 Heart failure, unspecified: Secondary | ICD-10-CM | POA: Diagnosis not present

## 2022-02-11 DIAGNOSIS — J441 Chronic obstructive pulmonary disease with (acute) exacerbation: Secondary | ICD-10-CM

## 2022-02-11 DIAGNOSIS — E119 Type 2 diabetes mellitus without complications: Secondary | ICD-10-CM | POA: Diagnosis not present

## 2022-02-11 LAB — HIV ANTIBODY (ROUTINE TESTING W REFLEX): HIV Screen 4th Generation wRfx: NONREACTIVE

## 2022-02-11 LAB — BASIC METABOLIC PANEL
Anion gap: 6 (ref 5–15)
BUN: 10 mg/dL (ref 8–23)
CO2: 31 mmol/L (ref 22–32)
Calcium: 8.5 mg/dL — ABNORMAL LOW (ref 8.9–10.3)
Chloride: 104 mmol/L (ref 98–111)
Creatinine, Ser: 0.73 mg/dL (ref 0.44–1.00)
GFR, Estimated: >60 mL/min (ref >60)
Glucose, Bld: 158 mg/dL — ABNORMAL HIGH (ref 70–99)
Potassium: 4.1 mmol/L (ref 3.5–5.1)
Sodium: 141 mmol/L (ref 135–145)

## 2022-02-11 LAB — CBC
HCT: 38.5 % (ref 36.0–46.0)
Hemoglobin: 11.6 g/dL — ABNORMAL LOW (ref 12.0–15.0)
MCH: 27.9 pg (ref 26.0–34.0)
MCHC: 30.1 g/dL (ref 30.0–36.0)
MCV: 92.5 fL (ref 80.0–100.0)
Platelets: 158 10*3/uL (ref 150–400)
RBC: 4.16 MIL/uL (ref 3.87–5.11)
RDW: 14.6 % (ref 11.5–15.5)
WBC: 12 10*3/uL — ABNORMAL HIGH (ref 4.0–10.5)
nRBC: 0 % (ref 0.0–0.2)

## 2022-02-11 LAB — ECHOCARDIOGRAM COMPLETE
AR max vel: 1.11 cm2
AV Area VTI: 1.1 cm2
AV Area mean vel: 1.11 cm2
AV Mean grad: 46 mmHg
AV Peak grad: 79.6 mmHg
Ao pk vel: 4.46 m/s
Area-P 1/2: 4.44 cm2
Height: 63 in
S' Lateral: 3.7 cm
Weight: 4636.8 oz

## 2022-02-11 LAB — GLUCOSE, CAPILLARY: Glucose-Capillary: 125 mg/dL — ABNORMAL HIGH (ref 70–99)

## 2022-02-11 LAB — PROCALCITONIN: Procalcitonin: 0.16 ng/mL

## 2022-02-11 LAB — CBG MONITORING, ED: Glucose-Capillary: 124 mg/dL — ABNORMAL HIGH (ref 70–99)

## 2022-02-11 MED ORDER — FUROSEMIDE 10 MG/ML IJ SOLN
20.0000 mg | Freq: Two times a day (BID) | INTRAMUSCULAR | Status: DC
  Administered 2022-02-11 – 2022-02-12 (×2): 20 mg via INTRAVENOUS
  Filled 2022-02-11 (×2): qty 2

## 2022-02-11 MED ORDER — METFORMIN HCL 500 MG PO TABS
500.0000 mg | ORAL_TABLET | Freq: Two times a day (BID) | ORAL | Status: DC
  Filled 2022-02-11: qty 1

## 2022-02-11 MED ORDER — PREDNISONE 50 MG PO TABS
50.0000 mg | ORAL_TABLET | Freq: Every day | ORAL | Status: DC
  Administered 2022-02-12: 50 mg via ORAL
  Filled 2022-02-11: qty 1

## 2022-02-11 MED ORDER — IPRATROPIUM-ALBUTEROL 0.5-2.5 (3) MG/3ML IN SOLN
3.0000 mL | RESPIRATORY_TRACT | Status: DC | PRN
  Administered 2022-02-11: 3 mL via RESPIRATORY_TRACT
  Filled 2022-02-11: qty 3

## 2022-02-11 MED ORDER — INSULIN ASPART 100 UNIT/ML IJ SOLN: 0.0000 [IU] | Freq: Every day | INTRAMUSCULAR | Status: DC

## 2022-02-11 MED ORDER — INSULIN ASPART 100 UNIT/ML IJ SOLN: 0.0000 [IU] | Freq: Three times a day (TID) | INTRAMUSCULAR | Status: DC

## 2022-02-11 NOTE — Progress Notes (Signed)
*  PRELIMINARY RESULTS* Echocardiogram 2D Echocardiogram has been performed.  Kristie Sanchez 02/11/2022, 9:01 AM

## 2022-02-11 NOTE — ED Notes (Signed)
The pt's O2 saturation is in the 80s while sleeping and on 4L supplemental O2 n/c. The pt denies any hx of OSA, the provider Romeo Apple, [place] has been made aware and states an order for Cpap has been placed for the patient at this time.

## 2022-02-11 NOTE — ED Notes (Signed)
Pt given ice chips per request

## 2022-02-11 NOTE — Progress Notes (Signed)
[facility]  - [place] at [facility]   PATIENT NAME: Kristie Sanchez    MR#:  [id number]  DATE OF BIRTH:  10/05/1955  SUBJECTIVE:  no family at bedside. Patient came in with increasing shortness of breath for six months more so for last few days. Has leg swelling. Found to be hypoxic and volume overloaded with COPD flare. Patient wanting to go home. Discussed importance of treatment and staying put for now. Patient vapes nicotine. No fever.   VITALS:  Blood pressure 133/78, pulse 81, temperature 98 F (36.7 C), temperature source Oral, resp. rate 18, height 5\' 3"  (1.6 m), weight 131.5 kg, SpO2 98 %.  PHYSICAL EXAMINATION:   GENERAL:  66 y.o.-year-old patient lying in the bed with no acute distress. Morbidly obese LUNGS: decreased breath sounds bilaterally, no wheezing CARDIOVASCULAR: S1, S2 normal. No murmur ABDOMEN: Soft, nontender, nondistended. Bowel sounds present.  EXTREMITIES: ++ edema b/l.    NEUROLOGIC: nonfocal  patient is alert and awake SKIN: No obvious rash, lesion, or ulcer.   LABORATORY PANEL:  CBC Recent Labs  Lab 02/11/22 0440  WBC 12.0*  HGB 11.6*  HCT 38.5  PLT 158    Chemistries  Recent Labs  Lab 02/10/22 1408 02/10/22 1409 02/10/22 1409 02/11/22 0440  NA  --  141   < > 141  K  --  3.9   < > 4.1  CL  --  102   < > 104  CO2  --  33*   < > 31  GLUCOSE  --  170*   < > 158*  BUN  --  10   < > 10  CREATININE  --  0.66   < > 0.73  CALCIUM  --  9.1   < > 8.5*  MG 2.2  --   --   --   AST  --  14*  --   --   ALT  --  17  --   --   ALKPHOS  --  62  --   --   BILITOT  --  0.6  --   --    < > = values in this interval not displayed.   Cardiac Enzymes No results for input(s): "TROPONINI" in the last 168 hours. RADIOLOGY:  ECHOCARDIOGRAM COMPLETE  Result Date: 02/11/2022    ECHOCARDIOGRAM REPORT   Patient Name:   Kristie Sanchez Date of Exam: 02/11/2022 Medical Rec #:  [id number]       Height:       63.0 in Accession #:    [id number]       Weight:       289.8 lb Date of Birth:  06/06/1955       BSA:          2.264 m Patient Age:    66 years        BP:           141/64 mmHg Patient Gender: F               HR:           96 bpm. Exam Location:  [facility] Procedure: 2D Echo, Color Doppler and Cardiac Doppler Indications:     R06.00 Dyspnea  History:         Patient has no prior history of Echocardiogram examinations.                  Signs/Symptoms:Shortness of Breath; Risk Factors:Hypertension  and Current Smoker.  Sonographer:     Humphrey Rolls Referring Phys:  [id number] AMY N COX Diagnosing Phys: Alwyn Pea MD  Sonographer Comments: Suboptimal parasternal window. Image acquisition challenging due to patient body habitus. IMPRESSIONS  1. Left ventricular ejection fraction, by estimation, is 50 to 55%. The left ventricle has low normal function. The left ventricle has no regional wall motion abnormalities. The left ventricular internal cavity size was mildly dilated. Left ventricular diastolic parameters are consistent with Grade I diastolic dysfunction (impaired relaxation).  2. Right ventricular systolic function is normal. The right ventricular size is normal.  3. Left atrial size was mild to moderately dilated.  4. Right atrial size was mildly dilated.  5. The mitral valve is normal in structure. Trivial mitral valve regurgitation.  6. The aortic valve is calcified. Aortic valve regurgitation is trivial. Mild aortic valve stenosis. FINDINGS  Left Ventricle: Left ventricular ejection fraction, by estimation, is 50 to 55%. The left ventricle has low normal function. The left ventricle has no regional wall motion abnormalities. The left ventricular internal cavity size was mildly dilated. There is borderline left ventricular hypertrophy. Left ventricular diastolic parameters are consistent with Grade I diastolic dysfunction (impaired relaxation). Right Ventricle: The right ventricular size is normal. No increase in right ventricular wall  thickness. Right ventricular systolic function is normal. Left Atrium: Left atrial size was mild to moderately dilated. Right Atrium: Right atrial size was mildly dilated. Pericardium: There is no evidence of pericardial effusion. Mitral Valve: The mitral valve is normal in structure. Trivial mitral valve regurgitation. Tricuspid Valve: The tricuspid valve is normal in structure. Tricuspid valve regurgitation is mild. Aortic Valve: The aortic valve is calcified. Aortic valve regurgitation is trivial. Mild aortic stenosis is present. Aortic valve mean gradient measures 46.0 mmHg. Aortic valve peak gradient measures 79.6 mmHg. Aortic valve area, by VTI measures 1.10 cm. Pulmonic Valve: The pulmonic valve was normal in structure. Pulmonic valve regurgitation is not visualized. Aorta: The ascending aorta was not well visualized. IAS/Shunts: No atrial level shunt detected by color flow Doppler.  LEFT VENTRICLE PLAX 2D LVIDd:         4.90 cm   Diastology LVIDs:         3.70 cm   LV e' medial:    10.90 cm/s LV PW:         1.00 cm   LV E/e' medial:  10.6 LV IVS:        0.80 cm   LV e' lateral:   8.49 cm/s LVOT diam:     2.10 cm   LV E/e' lateral: 13.5 LV SV:         82 LV SV Index:   36 LVOT Area:     3.46 cm  RIGHT VENTRICLE RV Basal diam:  3.80 cm LEFT ATRIUM           Index        RIGHT ATRIUM           Index LA diam:      4.40 cm 1.94 cm/m   RA Area:     13.80 cm LA Vol (A2C): 34.4 ml 15.20 ml/m  RA Volume:   33.10 ml  14.62 ml/m LA Vol (A4C): 35.1 ml 15.51 ml/m  AORTIC VALVE AV Area (Vmax):    1.11 cm AV Area (Vmean):   1.11 cm AV Area (VTI):     1.10 cm AV Vmax:           446.00  cm/s AV Vmean:          317.000 cm/s AV VTI:            0.746 m AV Peak Grad:      79.6 mmHg AV Mean Grad:      46.0 mmHg LVOT Vmax:         143.00 cm/s LVOT Vmean:        102.000 cm/s LVOT VTI:          0.237 m LVOT/AV VTI ratio: 0.32  AORTA Ao Root diam: 2.90 cm MITRAL VALVE MV Area (PHT): 4.44 cm     SHUNTS MV Decel Time: 171  msec     Systemic VTI:  0.24 m MV E velocity: 115.00 cm/s  Systemic Diam: 2.10 cm MV A velocity: 121.00 cm/s MV E/A ratio:  0.95 Dwayne D Callwood MD Electronically signed by Alwyn Pea MD Signature Date/Time: 02/11/2022/12:02:44 PM    Final    DG Chest 2 View  Result Date: 02/10/2022 CLINICAL DATA:  Shortness of breath and cough for 1 week. EXAM: CHEST - 2 VIEW COMPARISON:  None Available. FINDINGS: The mediastinal contour is normal. Heart size is upper limits of normal. Mild pulmonary edema is identified. There is no focal pneumonia or pleural effusion. The visualized skeletal structures are unremarkable. IMPRESSION: Mild pulmonary edema. Electronically Signed   By: Sherian Rein M.D.   On: 02/10/2022 12:06    Assessment and Plan  Kristie Sanchez is a 66 year old female with obesity, hypertension, nicotine use, who presents emergency department for chief concerns of 1 week of worsening shortness of breath and leg swelling.  BNP is 49.8. COVID/influenza A/influenza B PCR were negative. RSV was negative.   New onset of congestive heart failure (HCC) Acute Diastolic COPD flare, ongoing vaping --Worsening dyspnea especially with exertion - Strict I's and O's - Patient is status post furosemide 20 mg IV bid --cont lisinopirl --d/c HCTZ --assess for home oxygen --short course of po prednisone, prn nebs --I /O, daily weights --consider cardiology f/u ([facility]) at discharge   Morbid obesity with body mass index (BMI) of 50.0 to 59.9 in adult [facility]) Suspected OSA - This complicates overall care and prognosis.  - Counseled patient on healthy weight loss -- patient will need formal sleep study is outpatient   Nicotine dependence - Patient vapes nicotine daily - Nicotine patch as needed ordered - Counseling given on vaping cessation including risk of acute lung injury, pneumothorax, death   Depression - on  venlafaxine qd   Essential hypertension - on lisinopril  DM-2, hyperglycemia,  new --A1c 6.9% --SSI and Metformin  Procedures: Family communication :none Consults :none CODE STATUS: full DVT Prophylaxis : enoxaparin Level of care: Telemetry Cardiac Status is: Inpatient Remains inpatient appropriate because: CHF, COPD--new    TOTAL TIME TAKING CARE OF THIS PATIENT: 35 minutes.  >50% time spent on counselling and coordination of care  Note: This dictation was prepared with Dragon dictation along with smaller phrase technology. Any transcriptional errors that result from this process are unintentional.  Enedina Finner M.D    [facility]   CC: Primary care physician; Toy Cookey, FNP

## 2022-02-11 NOTE — ED Notes (Signed)
RT @BS  to place the pt on Cpap at this time.

## 2022-02-11 NOTE — ED Notes (Signed)
Advised nurse that poatient has ready bed

## 2022-02-11 NOTE — ED Notes (Signed)
The pt has asked to remove her Cpap mask at this time, the pt has been placed back on 3L n/c. Close monitoring continued.

## 2022-02-12 ENCOUNTER — Encounter: Payer: Self-pay | Admitting: Internal Medicine

## 2022-02-12 DIAGNOSIS — I509 Heart failure, unspecified: Secondary | ICD-10-CM | POA: Diagnosis not present

## 2022-02-12 LAB — GLUCOSE, CAPILLARY
Glucose-Capillary: 119 mg/dL — ABNORMAL HIGH (ref 70–99)
Glucose-Capillary: 170 mg/dL — ABNORMAL HIGH (ref 70–99)

## 2022-02-12 LAB — PROCALCITONIN: Procalcitonin: <0.1 ng/mL

## 2022-02-12 MED ORDER — PREDNISONE 20 MG PO TABS: 20.0000 mg | ORAL_TABLET | Freq: Every day | ORAL | 0 refills | Status: AC

## 2022-02-12 MED ORDER — PREDNISONE 20 MG PO TABS: 20.0000 mg | ORAL_TABLET | Freq: Every day | ORAL | Status: DC

## 2022-02-12 MED ORDER — IPRATROPIUM-ALBUTEROL 20-100 MCG/ACT IN AERS: 1.0000 | INHALATION_SPRAY | Freq: Four times a day (QID) | RESPIRATORY_TRACT | 2 refills | Status: AC | PRN

## 2022-02-12 MED ORDER — DULERA 100-5 MCG/ACT IN AERO: 2.0000 | INHALATION_SPRAY | Freq: Two times a day (BID) | RESPIRATORY_TRACT | 2 refills | Status: AC

## 2022-02-12 MED ORDER — LISINOPRIL 5 MG PO TABS: 5.0000 mg | ORAL_TABLET | Freq: Every day | ORAL | Status: DC

## 2022-02-12 MED ORDER — IPRATROPIUM-ALBUTEROL 20-100 MCG/ACT IN AERS: 1.0000 | INHALATION_SPRAY | Freq: Four times a day (QID) | RESPIRATORY_TRACT | Status: DC | PRN

## 2022-02-12 MED ORDER — FUROSEMIDE 10 MG/ML IJ SOLN
20.0000 mg | Freq: Once | INTRAMUSCULAR | Status: AC
  Administered 2022-02-12: 20 mg via INTRAVENOUS
  Filled 2022-02-12: qty 2

## 2022-02-12 MED ORDER — IPRATROPIUM-ALBUTEROL 0.5-2.5 (3) MG/3ML IN SOLN: 3.0000 mL | RESPIRATORY_TRACT | 1 refills | Status: AC | PRN

## 2022-02-12 MED ORDER — AZITHROMYCIN 250 MG PO TABS: 250.0000 mg | ORAL_TABLET | Freq: Every day | ORAL | Status: DC

## 2022-02-12 MED ORDER — METFORMIN HCL ER 500 MG PO TB24
500.0000 mg | ORAL_TABLET | Freq: Every day | ORAL | Status: DC
  Administered 2022-02-12: 500 mg via ORAL
  Filled 2022-02-12: qty 1

## 2022-02-12 MED ORDER — NICOTINE 21 MG/24HR TD PT24: 21.0000 mg | MEDICATED_PATCH | Freq: Every day | TRANSDERMAL | 0 refills | Status: DC | PRN

## 2022-02-12 MED ORDER — BLOOD GLUCOSE MONITOR KIT: PACK | 0 refills | Status: AC

## 2022-02-12 MED ORDER — AZITHROMYCIN 250 MG PO TABS: ORAL_TABLET | ORAL | 0 refills | Status: DC

## 2022-02-12 MED ORDER — AZITHROMYCIN 250 MG PO TABS
500.0000 mg | ORAL_TABLET | Freq: Every day | ORAL | Status: AC
  Administered 2022-02-12: 500 mg via ORAL
  Filled 2022-02-12: qty 2

## 2022-02-12 MED ORDER — METFORMIN HCL ER 500 MG PO TB24: 500.0000 mg | ORAL_TABLET | Freq: Every day | ORAL | 0 refills | Status: DC

## 2022-02-12 MED ORDER — FUROSEMIDE 40 MG PO TABS: 40.0000 mg | ORAL_TABLET | Freq: Every day | ORAL | 1 refills | Status: DC

## 2022-02-12 NOTE — Consult Note (Signed)
   Heart Failure Nurse Navigator Note  HFpEF 50 to 55%.  Left ventricular internal cavity dilated.  Moderate left atrial enlargement.  Mild right atrial enlargement.  She presented to the emergency room from urgent care with complaints of worsening shortness of breath and leg swelling for 1 week.  Chest x-ray revealed mild pulmonary edema.  BNP 49.8, BMI is 51.4.  Comorbidities:  Hypertension Depression Obesity COPD Type 2 diabetes  Medications:  Zithromax 500 mg daily Lisinopril 5 mg daily Furosemide 40 mg daily  Labs:  Sodium 141, potassium 4.1, chloride 104, CO2 31, BUN 10, creatinine 0.73, estimated GFR greater than 60. Weight is 131.7 kg Blood pressure 125/71 Intake 450 mL Output 1150 mL  Initial meeting with patient, she is sitting up in chair at bedside.  States that she has heard the term heart failure in the past but not in regards to herself.  She lives at home with son, his wife and their children.  Each  member takes part in preparing the meals.  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  States that she quit smoking cigarettes 9 years ago.  States that she continues to cough and expectorate greenish-brown sputum.  States that they tried to buy fresh or frozen fruits and vegetables and lean meats.  Very rarely uses vegetables in a can.  They very rarely eat at restaurants.  Discussed the importance of low-sodium and removing the saltshaker from the table she voices understanding.  Also stressed to fluid restriction of no more than 64 ounces daily.  She states that she is constantly drinking water throughout the day.  She states that she has a scale but does not weigh herself on a daily basis.  Explained the importance of daily weights and what to report.  Made aware of follow-up appointment in the outpatient heart failure clinic for December 11 at 930.  She has not 33% no-show which is 2 out of 6 appointments.  She was given the living with heart  failure teaching booklet, zone magnet, info on heart failure and low-sodium along with weight chart.  She had no further questions.  Tresa Endo RN [facility]

## 2022-02-12 NOTE — Discharge Instructions (Addendum)

## 2022-02-12 NOTE — Progress Notes (Signed)
SATURATION QUALIFICATIONS: (This note is used to comply with regulatory documentation for home oxygen)  Patient Saturations on Room Air at Rest = 91%  Patient Saturations on Room Air while Ambulating = 81%  Patient Saturations on 2 Liters of oxygen while Ambulating = 91%  Please briefly explain why patient needs home oxygen: CHF/COPD

## 2022-02-12 NOTE — Progress Notes (Signed)
Patient alert and oriented, vss, no complaints of pain.  D/c telemetry and PIV.  Patient showered.  0xygen delivered to patients room.    Son at bedside during discharge paperwork and all questions were answered at that time.    Patient will be escorted out of hospital via wheelchair by nursing staff.

## 2022-02-12 NOTE — Discharge Summary (Addendum)
Physician Discharge Summary   Patient: Kristie Sanchez MRN: [id number] DOB: 07-06-1955  Admit date:     02/10/2022  Discharge date: 02/12/22  Discharge Physician: Fritzi Mandes   PCP: Gennette Pac, FNP   Recommendations at discharge:   follow-up [facility] on your appointment that has been scheduled follow-up [facility] cardiology Dr. 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  Discharge Diagnoses: Principal Problem:   New onset of congestive heart failure ([place]) Active Problems:   Essential hypertension   Depression   Nicotine dependence   Obesity, Class III, BMI 40-49.9 (morbid obesity) ([place])   COPD with acute exacerbation ([place])   Type 2 diabetes mellitus without complication Kristie [facility])   Hospital Course: Kristie Sanchez is a 66 year old female with obesity, hypertension, nicotine use, who presents emergency department for chief concerns of 1 week of worsening shortness of breath and leg swelling.   BNP is 49.8. COVID/influenza A/influenza B PCR were negative. RSV was negative.    New onset of congestive heart failure (HCC) Acute Diastolic COPD flare, ongoing vaping Acute Bronchitis --Worsening dyspnea especially with exertion - Strict I's and O's - Patient is getting furosemide 20 mg IV bid--change to po lasix 40 mg qd --cont lisinopirl --d/c HCTZ --assess for home oxygen--pt qualifies for oxygen --short course of po prednisone, prn nebs --I /O, daily weights -cardiology f/u ([place]) at discharge--Dr Parachos and [facility] CHF clinic --pt is insisting on going home. --z-pak at discharge   Morbid obesity with body mass index (BMI) of 50.0 to 59.9 in adult [facility]) Suspected OSA - This complicates overall care and prognosis.  - Counseled patient on healthy weight  loss -- patient will need formal sleep study is outpatient--defer to PCP   Nicotine dependence - Patient vapes nicotine daily - Nicotine patch as needed ordered - Counseling given on vaping cessation including risk of acute lung injury, pneumothorax, death   Depression - on  venlafaxine qd   Essential hypertension - on lisinopril   DM-2, hyperglycemia, new --A1c 6.9% --SSI and discuss regarding starting Metformin. Pt's son wants to wait and try diet and exercise at home --keep log of sugars for PCP to review   Procedures: Family communication :Son Wille Glaser on the phone Consults :none CODE STATUS: full DVT Prophylaxis : enoxaparin      Consultants: none  Disposition: Home Diet recommendation:  Discharge Diet Orders (From admission, onward)     Start     Ordered   02/12/22 0000  Diet - low sodium heart healthy        02/12/22 1119           Cardiac and Carb modified diet DISCHARGE MEDICATION: Allergies as of 02/12/2022       Reactions   Sulfa Antibiotics Swelling        Medication List     STOP taking these medications    hydrochlorothiazide 12.5 MG tablet Commonly known as: HYDRODIURIL       TAKE these medications    blood glucose meter kit and supplies Kit Dispense based on patient and insurance preference. Use up to four times daily as directed.   Dulera 100-5 MCG/ACT Aero Generic drug: mometasone-formoterol Inhale 2 puffs into the lungs 2 (two) times daily.   furosemide 40 MG tablet Commonly known  as: Lasix Take 1 tablet (40 mg total) by mouth daily.   ipratropium-albuterol 0.5-2.5 (3) MG/3ML Soln Commonly known as: DUONEB Take 3 mLs by nebulization every 4 (four) hours as needed.   Ipratropium-Albuterol 20-100 MCG/ACT Aers respimat Commonly known as: COMBIVENT Inhale 1 puff into the lungs every 6 (six) hours as needed for wheezing.   lisinopril 5 MG tablet Commonly known as: ZESTRIL Take 5 mg by mouth in the morning and at bedtime.    metFORMIN 500 MG 24 hr tablet Commonly known as: GLUCOPHAGE-XR Take 1 tablet (500 mg total) by mouth daily with breakfast.   nicotine 21 mg/24hr patch Commonly known as: NICODERM CQ - dosed in mg/24 hours Place 1 patch (21 mg total) onto the skin daily as needed (nicotine craving).   predniSONE 20 MG tablet Commonly known as: DELTASONE Take 1 tablet (20 mg total) by mouth daily with breakfast for 4 days. Start taking on: February 13, 2022   venlafaxine XR 150 MG 24 hr capsule Commonly known as: EFFEXOR-XR Take 150 mg by mouth daily with breakfast. What changed: Another medication with the same name was removed. Continue taking this medication, and follow the directions you see here.               Durable Medical Equipment  (From admission, onward)           Start     Ordered   02/12/22 1039  For home use only DME Nebulizer machine  Once       Question Answer Comment  Patient needs a nebulizer to treat with the following condition COPD (chronic obstructive pulmonary disease) ([place])   Length of Need 6 Months      02/12/22 1038   02/12/22 1023  For home use only DME oxygen  Once       Question Answer Comment  Length of Need Lifetime   Mode or (Route) Nasal cannula   Liters per Minute 2   Frequency Continuous (stationary and portable oxygen unit needed)   Oxygen conserving device Yes   Oxygen delivery system Gas      02/12/22 1022   02/12/22 1023  For home use only DME Nebulizer machine  Once       Question Answer Comment  Patient needs a nebulizer to treat with the following condition COPD (chronic obstructive pulmonary disease) ([place])   Length of Need Lifetime      02/12/22 1022            Follow-up Information     Paraschos, Alexander, MD. Go in 1 week(s).   Specialty: Cardiology Contact information: [facility] [place] [address] [phone]         [place]. Go to.   Specialty: Cardiology Why: on your appt Contact information: [address] [place], [place], [place]. Schedule an appointment as soon as possible for a visit in 1 week(s).   Specialty: Family Medicine Why: hospital f/u  check BMP and adjust meds (diuretics) accordingly Contact information: [place] [address] [phone]                Discharge Exam: Filed Weights   02/10/22 1700 02/10/22 1702 02/11/22 1931  Weight: (!) 136.5 kg 131.5 kg 131.7 kg     Condition at discharge: fair  The results of significant diagnostics from this hospitalization (including  imaging, microbiology, ancillary and laboratory) are listed below for reference.   Imaging Studies: ECHOCARDIOGRAM COMPLETE  Result Date: 02/11/2022    ECHOCARDIOGRAM REPORT   Patient Name:   TALLEY CASCO Date of Exam: 02/11/2022 Medical Rec #:  [id number]       Height:       63.0 in Accession #:    [id number]      Weight:       289.8 lb Date of Birth:  05-Jan-1956       BSA:          2.264 m Patient Age:    6 years        BP:           141/64 mmHg Patient Gender: F               HR:           96 bpm. Exam Location:  [facility] Procedure: 2D Echo, Color Doppler and Cardiac Doppler Indications:     R06.00 Dyspnea  History:         Patient has no prior history of Echocardiogram examinations.                  Signs/Symptoms:Shortness of Breath; Risk Factors:Hypertension                  and Current Smoker.  Sonographer:     Charmayne Sheer Referring Phys:  [id number] AMY N COX Diagnosing Phys: Yolonda Kida MD  Sonographer Comments: Suboptimal parasternal window. Image acquisition challenging due to patient body habitus. IMPRESSIONS  1. Left ventricular ejection fraction, by estimation, is 50 to 55%. The left ventricle has low normal function. The left ventricle has no regional wall motion abnormalities. The left ventricular internal  cavity size was mildly dilated. Left ventricular diastolic parameters are consistent with Grade I diastolic dysfunction (impaired relaxation).  2. Right ventricular systolic function is normal. The right ventricular size is normal.  3. Left atrial size was mild to moderately dilated.  4. Right atrial size was mildly dilated.  5. The mitral valve is normal in structure. Trivial mitral valve regurgitation.  6. The aortic valve is calcified. Aortic valve regurgitation is trivial. Mild aortic valve stenosis. FINDINGS  Left Ventricle: Left ventricular ejection fraction, by estimation, is 50 to 55%. The left ventricle has low normal function. The left ventricle has no regional wall motion abnormalities. The left ventricular internal cavity size was mildly dilated. There is borderline left ventricular hypertrophy. Left ventricular diastolic parameters are consistent with Grade I diastolic dysfunction (impaired relaxation). Right Ventricle: The right ventricular size is normal. No increase in right ventricular wall thickness. Right ventricular systolic function is normal. Left Atrium: Left atrial size was mild to moderately dilated. Right Atrium: Right atrial size was mildly dilated. Pericardium: There is no evidence of pericardial effusion. Mitral Valve: The mitral valve is normal in structure. Trivial mitral valve regurgitation. Tricuspid Valve: The tricuspid valve is normal in structure. Tricuspid valve regurgitation is mild. Aortic Valve: The aortic valve is calcified. Aortic valve regurgitation is trivial. Mild aortic stenosis is present. Aortic valve mean gradient measures 46.0 mmHg. Aortic valve peak gradient measures 79.6 mmHg. Aortic valve area, by VTI measures 1.10 cm. Pulmonic Valve: The pulmonic valve was normal in structure. Pulmonic valve regurgitation is not visualized. Aorta: The ascending aorta was not well visualized. IAS/Shunts: No atrial level shunt detected by color flow Doppler.  LEFT VENTRICLE PLAX  2D LVIDd:  4.90 cm   Diastology LVIDs:         3.70 cm   LV e' medial:    10.90 cm/s LV PW:         1.00 cm   LV E/e' medial:  10.6 LV IVS:        0.80 cm   LV e' lateral:   8.49 cm/s LVOT diam:     2.10 cm   LV E/e' lateral: 13.5 LV SV:         82 LV SV Index:   36 LVOT Area:     3.46 cm  RIGHT VENTRICLE RV Basal diam:  3.80 cm LEFT ATRIUM           Index        RIGHT ATRIUM           Index LA diam:      4.40 cm 1.94 cm/m   RA Area:     13.80 cm LA Vol (A2C): 34.4 ml 15.20 ml/m  RA Volume:   33.10 ml  14.62 ml/m LA Vol (A4C): 35.1 ml 15.51 ml/m  AORTIC VALVE AV Area (Vmax):    1.11 cm AV Area (Vmean):   1.11 cm AV Area (VTI):     1.10 cm AV Vmax:           446.00 cm/s AV Vmean:          317.000 cm/s AV VTI:            0.746 m AV Peak Grad:      79.6 mmHg AV Mean Grad:      46.0 mmHg LVOT Vmax:         143.00 cm/s LVOT Vmean:        102.000 cm/s LVOT VTI:          0.237 m LVOT/AV VTI ratio: 0.32  AORTA Ao Root diam: 2.90 cm MITRAL VALVE MV Area (PHT): 4.44 cm     SHUNTS MV Decel Time: 171 msec     Systemic VTI:  0.24 m MV E velocity: 115.00 cm/s  Systemic Diam: 2.10 cm MV A velocity: 121.00 cm/s MV E/A ratio:  0.95 Dwayne D Callwood MD Electronically signed by Yolonda Kida MD Signature Date/Time: 02/11/2022/12:02:44 PM    Final    DG Chest 2 View  Result Date: 02/10/2022 CLINICAL DATA:  Shortness of breath and cough for 1 week. EXAM: CHEST - 2 VIEW COMPARISON:  None Available. FINDINGS: The mediastinal contour is normal. Heart size is upper limits of normal. Mild pulmonary edema is identified. There is no focal pneumonia or pleural effusion. The visualized skeletal structures are unremarkable. IMPRESSION: Mild pulmonary edema. Electronically Signed   By: Abelardo Diesel M.D.   On: 02/10/2022 12:06    Microbiology: Results for orders placed or performed during the hospital encounter of 02/10/22  Resp panel by RT-PCR (RSV, Flu A&B, Covid) Anterior Nasal Swab     Status: None   Collection  Time: 02/10/22 11:45 AM   Specimen: Anterior Nasal Swab  Result Value Ref Range Status   SARS Coronavirus 2 by RT PCR NEGATIVE NEGATIVE Final    Comment: (NOTE) SARS-CoV-2 target nucleic acids are NOT DETECTED.  The SARS-CoV-2 RNA is generally detectable in upper respiratory specimens during the acute phase of infection. The lowest concentration of SARS-CoV-2 viral copies this assay can detect is 138 copies/mL. A negative result does not preclude SARS-Cov-2 infection and should not be used as the sole basis for treatment  or other patient management decisions. A negative result may occur with  improper specimen collection/handling, submission of specimen other than nasopharyngeal swab, presence of viral mutation(s) within the areas targeted by this assay, and inadequate number of viral copies(<138 copies/mL). A negative result must be combined with clinical observations, patient history, and epidemiological information. The expected result is Negative.  Fact Sheet for Patients:  EntrepreneurPulse.com.au  Fact Sheet for Healthcare Providers:  IncredibleEmployment.be  This test is no t yet approved or cleared by the [place] FDA and  has been authorized for detection and/or diagnosis of SARS-CoV-2 by FDA under an Emergency Use Authorization (EUA). This EUA will remain  in effect (meaning this test can be used) for the duration of the COVID-19 declaration under Section 564(b)(1) of the Act, 21 U.S.C.section 360bbb-3(b)(1), unless the authorization is terminated  or revoked sooner.       Influenza A by PCR NEGATIVE NEGATIVE Final   Influenza B by PCR NEGATIVE NEGATIVE Final    Comment: (NOTE) The Xpert Xpress SARS-CoV-2/FLU/RSV plus assay is intended as an aid in the diagnosis of influenza from Nasopharyngeal swab specimens and should not be used as a sole basis for treatment. Nasal washings and aspirates are unacceptable for Xpert Xpress  SARS-CoV-2/FLU/RSV testing.  Fact Sheet for Patients: EntrepreneurPulse.com.au  Fact Sheet for Healthcare Providers: IncredibleEmployment.be  This test is not yet approved or cleared by the [place] FDA and has been authorized for detection and/or diagnosis of SARS-CoV-2 by FDA under an Emergency Use Authorization (EUA). This EUA will remain in effect (meaning this test can be used) for the duration of the COVID-19 declaration under Section 564(b)(1) of the Act, 21 U.S.C. section 360bbb-3(b)(1), unless the authorization is terminated or revoked.     Resp Syncytial Virus by PCR NEGATIVE NEGATIVE Final    Comment: (NOTE) Fact Sheet for Patients: EntrepreneurPulse.com.au  Fact Sheet for Healthcare Providers: IncredibleEmployment.be  This test is not yet approved or cleared by the [place] FDA and has been authorized for detection and/or diagnosis of SARS-CoV-2 by FDA under an Emergency Use Authorization (EUA). This EUA will remain in effect (meaning this test can be used) for the duration of the COVID-19 declaration under Section 564(b)(1) of the Act, 21 U.S.C. section 360bbb-3(b)(1), unless the authorization is terminated or revoked.  Performed at [facility], [address] [place], [place] [address]     Labs: CBC: Recent Labs  Lab 02/10/22 1409 02/11/22 0440  WBC 11.4* 12.0*  NEUTROABS 8.7*  --   HGB 13.2 11.6*  HCT 42.8 38.5  MCV 91.3 92.5  PLT 187 175   Basic Metabolic Panel: Recent Labs  Lab 02/10/22 1408 02/10/22 1409 02/11/22 0440  NA  --  141 141  K  --  3.9 4.1  CL  --  102 104  CO2  --  33* 31  GLUCOSE  --  170* 158*  BUN  --  10 10  CREATININE  --  0.66 0.73  CALCIUM  --  9.1 8.5*  MG 2.2  --   --   PHOS 3.1  --   --    Liver Function Tests: Recent Labs  Lab 02/10/22 1409  AST 14*  ALT 17  ALKPHOS 62  BILITOT 0.6  PROT 7.4  ALBUMIN 3.3*    CBG: Recent Labs  Lab 02/11/22 1624 02/11/22 1954 02/12/22 0827  GLUCAP 124* 125* 119*    Discharge time spent: greater than 30 minutes.  Signed: Fritzi Mandes, MD [facility] 02/12/2022

## 2022-02-12 NOTE — Progress Notes (Signed)
Patient to dc on 2L O2, ordered via [employer] to be delivered to hospital room.   No further dc needs.   Darolyn Rua, [place], MSW, [place] [phone]

## 2022-02-13 LAB — HEMOGLOBIN A1C
Hgb A1c MFr Bld: 6.9 % — ABNORMAL HIGH (ref 4.8–5.6)
Mean Plasma Glucose: 151 mg/dL

## 2022-02-16 NOTE — Progress Notes (Unsigned)
   Patient ID: Jeanae Whitmill, female    DOB: [birth year], 66 y.o.   MRN: [id number]  HPI  Ms Korzeniewski is a 66 y/o female with a history of  Echo report from 02/11/22 showed an EF of 50-55% along with mild/moderate LAE and mild AS.   Admitted 02/10/22 due to 1 week of worsening shortness of breath and leg swelling.Received furosemide 20 mg IV bid--change to po lasix 40 mg qd. Discharged after 2 days.   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

## 2022-02-19 ENCOUNTER — Encounter: Payer: Self-pay | Admitting: Family

## 2022-02-19 ENCOUNTER — Ambulatory Visit: Payer: Medicare Other | Attending: Family | Admitting: Family

## 2022-02-19 VITALS — BP 107/72 | HR 83 | Resp 18 | Wt 297.0 lb

## 2022-02-19 DIAGNOSIS — J449 Chronic obstructive pulmonary disease, unspecified: Secondary | ICD-10-CM | POA: Insufficient documentation

## 2022-02-19 DIAGNOSIS — I110 Hypertensive heart disease with heart failure: Secondary | ICD-10-CM | POA: Diagnosis not present

## 2022-02-19 DIAGNOSIS — Z720 Tobacco use: Secondary | ICD-10-CM | POA: Diagnosis not present

## 2022-02-19 DIAGNOSIS — Z7984 Long term (current) use of oral hypoglycemic drugs: Secondary | ICD-10-CM | POA: Diagnosis not present

## 2022-02-19 DIAGNOSIS — R0683 Snoring: Secondary | ICD-10-CM | POA: Diagnosis not present

## 2022-02-19 DIAGNOSIS — R42 Dizziness and giddiness: Secondary | ICD-10-CM | POA: Diagnosis not present

## 2022-02-19 DIAGNOSIS — Z79899 Other long term (current) drug therapy: Secondary | ICD-10-CM | POA: Insufficient documentation

## 2022-02-19 DIAGNOSIS — Z716 Tobacco abuse counseling: Secondary | ICD-10-CM | POA: Diagnosis not present

## 2022-02-19 DIAGNOSIS — R058 Other specified cough: Secondary | ICD-10-CM | POA: Diagnosis present

## 2022-02-19 DIAGNOSIS — M79606 Pain in leg, unspecified: Secondary | ICD-10-CM | POA: Insufficient documentation

## 2022-02-19 DIAGNOSIS — Z87891 Personal history of nicotine dependence: Secondary | ICD-10-CM | POA: Insufficient documentation

## 2022-02-19 DIAGNOSIS — R0602 Shortness of breath: Secondary | ICD-10-CM | POA: Diagnosis not present

## 2022-02-19 DIAGNOSIS — I10 Essential (primary) hypertension: Secondary | ICD-10-CM | POA: Diagnosis not present

## 2022-02-19 DIAGNOSIS — I5032 Chronic diastolic (congestive) heart failure: Secondary | ICD-10-CM | POA: Diagnosis not present

## 2022-02-19 DIAGNOSIS — E119 Type 2 diabetes mellitus without complications: Secondary | ICD-10-CM | POA: Insufficient documentation

## 2022-02-19 MED ORDER — DAPAGLIFLOZIN PROPANEDIOL 10 MG PO TABS: 10.0000 mg | ORAL_TABLET | Freq: Every day | ORAL | 3 refills | Status: DC

## 2022-02-19 MED ORDER — DAPAGLIFLOZIN PROPANEDIOL 10 MG PO TABS: 10.0000 mg | ORAL_TABLET | Freq: Every day | ORAL | 5 refills | Status: AC

## 2022-02-19 NOTE — Patient Instructions (Addendum)
Continue weighing daily and call for an overnight weight gain of 3 pounds or more or a weekly weight gain of more than 5 pounds.   If you have voicemail, please make sure your mailbox is cleaned out so that we may leave a message and please make sure to listen to any voicemails.    Keep daily sodium intake to 2000mg .    Keep daily fluid intake to 60-64 ounces. This includes ice   Get compression socks and put them on every morning with removal at bedtime.    Start taking farxiga as 1 tablet every morning.

## 2022-02-22 ENCOUNTER — Telehealth: Payer: Self-pay | Admitting: Family

## 2022-02-22 NOTE — Telephone Encounter (Signed)
Patient was denied farxiga patient assistance due to having commercial insurance.    Sagan Wurzel, NT

## 2022-03-12 NOTE — Progress Notes (Unsigned)
Patient ID: Kristie Sanchez, female    DOB: [birth year], 67 y.o.   MRN: [id number]  HPI  Kristie Sanchez is a 67 y/o female with a history of DM, HTN, depression, DM, COPD, vaping and chronic heart failure.   Echo report from 02/11/22 showed an EF of 50-55% along with mild/moderate LAE and mild AS.   Admitted 02/10/22 due to 1 week of worsening shortness of breath and leg swelling.Received furosemide 20 mg IV bid--change to po lasix 40 mg qd. Discharged after 2 days.   She presents today for a follow-up visit with a chief complaint of minimal fatigue upon moderate exertion. Describes this as chronic although improving since she was last here. She has associated cough, pedal edema (improving) and leg pain along with this. She denies any difficulty sleeping, abdominal distention, palpitations, chest pain, wheezing, shortness of breath, dizziness or weight gain.   Hasn't been wearing her oxygen during the day but continues to wear it at night. Started walking 1-2 times/ day for ~ 10 minutes each time. 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 Has upcoming PCP appointment in 2 days for a walk test.   Family members endorse that patient does snore loud enough that her granddaughter, who sleeps upstairs, can hear the patient snoring. Currently has a sleep study scheduled for 04/09/22.  Past Medical History:  Diagnosis Date   CHF (congestive heart failure) (HCC)    COPD (chronic obstructive pulmonary disease) (HCC)    Depression    Diabetes mellitus without complication ([place])    Hypertension    No past surgical history on file. Family History  Problem Relation Age of Onset   Lymphoma Mother 96   Lymphoma Maternal Uncle    Lymphoma Maternal Aunt    Breast cancer Neg Hx    Social History   Tobacco Use   Smoking status: Former    Packs/day: 1.00    Years: 35.00    Total pack years: 35.00    Types: Cigarettes   Smokeless tobacco: Former    Quit date: 03/23/2011  Substance Use  Topics   Alcohol use: No    Alcohol/week: 0.0 standard drinks of alcohol   Allergies  Allergen Reactions   Sulfa Antibiotics Swelling   Prior to Admission medications   Medication Sig Start Date End Date Taking? Authorizing Provider  dapagliflozin propanediol (FARXIGA) 10 MG TABS tablet Take 1 tablet (10 mg total) by mouth daily before breakfast. 02/19/22  Yes Darylene Price A, FNP  furosemide (LASIX) 20 MG tablet Take 20 mg by mouth daily.   Yes [provider]  Ipratropium-Albuterol (COMBIVENT) 20-100 MCG/ACT AERS respimat Inhale 1 puff into the lungs every 6 (six) hours as needed for wheezing. 02/12/22  Yes Fritzi Mandes, MD  lisinopril (ZESTRIL) 10 MG tablet Take 10 mg by mouth daily.   Yes [provider]  metFORMIN (GLUCOPHAGE-XR) 500 MG 24 hr tablet Take 500 mg by mouth daily with breakfast. 02/16/22  Yes [provider]  mometasone-formoterol (DULERA) 100-5 MCG/ACT AERO Inhale 2 puffs into the lungs 2 (two) times daily. 02/12/22  Yes Fritzi Mandes, MD  venlafaxine XR (EFFEXOR-XR) 150 MG 24 hr capsule Take 150 mg by mouth daily with breakfast.   Yes [provider]  blood glucose meter kit and supplies KIT Dispense based on patient and insurance preference. Use up to four times daily as directed. Patient not taking: Reported on 03/13/2022 02/12/22   Fritzi Mandes, MD  ipratropium-albuterol (  DUONEB) 0.5-2.5 (3) MG/3ML SOLN Take 3 mLs by nebulization every 4 (four) hours as needed. Patient not taking: Reported on 02/19/2022 02/12/22   Fritzi Mandes, MD   Review of Systems  Constitutional:  Positive for fatigue (improving). Negative for appetite change.  HENT:  Negative for congestion, postnasal drip and sore throat.   Eyes: Negative.   Respiratory:  Positive for cough (improving). Negative for chest tightness, shortness of breath and wheezing.   Cardiovascular:  Positive for leg swelling (improving). Negative for chest pain and palpitations.  Gastrointestinal:   Negative for abdominal distention and abdominal pain.  Endocrine: Negative.   Genitourinary: Negative.   Musculoskeletal:  Positive for arthralgias (leg pain).  Skin: Negative.   Allergic/Immunologic: Negative.   Neurological:  Negative for dizziness and light-headedness.  Hematological:  Negative for adenopathy. Does not bruise/bleed easily.  Psychiatric/Behavioral:  Negative for dysphoric mood and sleep disturbance (sleeping on 2 pillows; + snoring). The patient is not nervous/anxious.    Vitals:   03/13/22 0932 03/13/22 1002  BP: (!) 156/77 (!) 105/59  Pulse: 85   Resp: 18   SpO2: 93%   Weight: 294 lb 4 oz (133.5 kg)    Wt Readings from Last 3 Encounters:  03/13/22 294 lb 4 oz (133.5 kg)  02/19/22 297 lb (134.7 kg)  02/11/22 290 lb 6.4 oz (131.7 kg)   Lab Results  Component Value Date   CREATININE 0.73 02/11/2022   CREATININE 0.66 02/10/2022   Physical Exam Vitals and nursing note reviewed. Exam conducted with a chaperone present (son & DIL).  Constitutional:      Appearance: Normal appearance.  HENT:     Head: Normocephalic and atraumatic.  Cardiovascular:     Rate and Rhythm: Normal rate and regular rhythm.  Pulmonary:     Effort: Pulmonary effort is normal.     Breath sounds: No wheezing, rhonchi or rales.  Abdominal:     General: Abdomen is flat. There is no distension.     Palpations: Abdomen is soft.     Tenderness: There is no abdominal tenderness.  Musculoskeletal:        General: No tenderness.     Cervical back: Normal range of motion and neck supple.     Right lower leg: Edema (1+ pitting) present.     Left lower leg: Edema (1+ pitting) present.  Skin:    General: Skin is warm and dry.  Neurological:     General: No focal deficit present.     Mental Status: She is alert and oriented to person, place, and time.  Psychiatric:        Mood and Affect: Mood normal.        Behavior: Behavior normal.        Thought Content: Thought content normal.     Assessment & Plan:  1: Chronic heart failure with preserved ejection fraction with structural changes (LAE)- - NYHA class II - euvolemic today - weighing daily; reminded to call for an overnight weight gain of > 2 pounds or a weekly weight gain of > 5 pounds - weight down 3 pounds from last visit here 3 weeks ago - on GDMT of lisinopril & farxiga - check BMP today as farxiga started at last visit - farxiga commercial copay card provided today - will not change her lisinopril to entresto today since BP recheck was much lower than initial BP - saw cardiology Clayborn Bigness) 03/07/22 - not adding salt and has been reading food labels for sodium content -  reviewed the importance of keeping fluid intake to 60-64 ounces which also includes her ice - tried wearing [employer] compression socks but says that she had great difficulty in getting them off; advised to go to [facility] store to get her legs measured - elevate legs when sitting for long periods of time - wears oxygen at 2L at bedtime but hasn't been wearing it during the day; has upcoming PCP appointment on 03/15/22 for a walk test and work release note - has started walking 1-2 times/ day for 10 minutes each time - BNP 02/10/22 was 49.8  2: HTN- - BP 156/77 initially after walking into the office; recheck was 105/59 - saw PCP Baldemar Lenis) 02/16/22; returns 03/15/22 - BMP 02/11/22 showed sodium 141, potassium 4.1, creatinine 0.73 & GFR >60  3: DM- - fairly new diagnosis; recently started taking metformin XR - not checking her glucose at home - A1c 02/11/22 was 6.9%  4: COPD- - no longer vaping as of 02/12/22 - continued cessation discussed - Dr. Clayborn Bigness made pulmonology referral; family is going to call Dr Gust Brooms office to get appt scheduled  5: Snoring-  - has sleep study scheduled on 04/09/22   Medication bottles reviewed.   Return in 2 months, sooner if needed.

## 2022-03-13 ENCOUNTER — Encounter: Payer: Self-pay | Admitting: Family

## 2022-03-13 ENCOUNTER — Other Ambulatory Visit
Admission: RE | Admit: 2022-03-13 | Discharge: 2022-03-13 | Disposition: A | Discharge status: Home / Self Care | Payer: Medicare Other | Source: Ambulatory Visit | Attending: Family | Admitting: Family

## 2022-03-13 ENCOUNTER — Ambulatory Visit (HOSPITAL_BASED_OUTPATIENT_CLINIC_OR_DEPARTMENT_OTHER): Payer: Medicare Other | Admitting: Family

## 2022-03-13 VITALS — BP 105/59 | HR 85 | Resp 18 | Wt 294.2 lb

## 2022-03-13 DIAGNOSIS — I5032 Chronic diastolic (congestive) heart failure: Secondary | ICD-10-CM | POA: Insufficient documentation

## 2022-03-13 DIAGNOSIS — E119 Type 2 diabetes mellitus without complications: Secondary | ICD-10-CM

## 2022-03-13 DIAGNOSIS — R600 Localized edema: Secondary | ICD-10-CM | POA: Insufficient documentation

## 2022-03-13 DIAGNOSIS — R058 Other specified cough: Secondary | ICD-10-CM | POA: Insufficient documentation

## 2022-03-13 DIAGNOSIS — M79606 Pain in leg, unspecified: Secondary | ICD-10-CM | POA: Insufficient documentation

## 2022-03-13 DIAGNOSIS — Z7984 Long term (current) use of oral hypoglycemic drugs: Secondary | ICD-10-CM | POA: Insufficient documentation

## 2022-03-13 DIAGNOSIS — R0683 Snoring: Secondary | ICD-10-CM

## 2022-03-13 DIAGNOSIS — J449 Chronic obstructive pulmonary disease, unspecified: Secondary | ICD-10-CM

## 2022-03-13 DIAGNOSIS — Z7951 Long term (current) use of inhaled steroids: Secondary | ICD-10-CM | POA: Insufficient documentation

## 2022-03-13 DIAGNOSIS — I10 Essential (primary) hypertension: Secondary | ICD-10-CM

## 2022-03-13 DIAGNOSIS — Z79899 Other long term (current) drug therapy: Secondary | ICD-10-CM | POA: Insufficient documentation

## 2022-03-13 DIAGNOSIS — F17290 Nicotine dependence, other tobacco product, uncomplicated: Secondary | ICD-10-CM | POA: Insufficient documentation

## 2022-03-13 DIAGNOSIS — F32A Depression, unspecified: Secondary | ICD-10-CM | POA: Insufficient documentation

## 2022-03-13 DIAGNOSIS — I110 Hypertensive heart disease with heart failure: Secondary | ICD-10-CM | POA: Insufficient documentation

## 2022-03-13 LAB — BASIC METABOLIC PANEL
Anion gap: 5 (ref 5–15)
BUN: 14 mg/dL (ref 8–23)
CO2: 32 mmol/L (ref 22–32)
Calcium: 8.9 mg/dL (ref 8.9–10.3)
Chloride: 102 mmol/L (ref 98–111)
Creatinine, Ser: 0.72 mg/dL (ref 0.44–1.00)
GFR, Estimated: >60 mL/min (ref >60)
Glucose, Bld: 103 mg/dL — ABNORMAL HIGH (ref 70–99)
Potassium: 4.4 mmol/L (ref 3.5–5.1)
Sodium: 139 mmol/L (ref 135–145)

## 2022-03-13 NOTE — Patient Instructions (Signed)
Continue weighing daily and call for an overnight weight gain of 3 pounds or more or a weekly weight gain of more than 5 pounds.   If you have voicemail, please make sure your mailbox is cleaned out so that we may leave a message and please make sure to listen to any voicemails.     

## 2022-03-20 ENCOUNTER — Other Ambulatory Visit (HOSPITAL_COMMUNITY): Payer: Self-pay

## 2022-03-22 ENCOUNTER — Telehealth: Payer: Self-pay | Admitting: Family

## 2022-03-22 NOTE — Telephone Encounter (Signed)
Patient came by and we made copies of all her insurance cards and I gave patient two different types of cards that apply for commerical patients in attempt to get her farxiga as she was denied patient assistance for farxiga due to having commerical insurance along with her medicare. I told patient If it doesn't work for her to call back up here and talk to [place], our pharmacist who will try to see her options on how to move forward.   Kristie Sanchez, NT

## 2022-03-27 ENCOUNTER — Other Ambulatory Visit (HOSPITAL_COMMUNITY): Payer: Self-pay

## 2022-04-06 ENCOUNTER — Other Ambulatory Visit: Payer: Self-pay | Admitting: Internal Medicine

## 2022-04-06 DIAGNOSIS — I10 Essential (primary) hypertension: Secondary | ICD-10-CM

## 2022-04-06 DIAGNOSIS — R0602 Shortness of breath: Secondary | ICD-10-CM

## 2022-04-23 ENCOUNTER — Ambulatory Visit: Admission: RE | Admit: 2022-04-23 | Payer: 59 | Source: Ambulatory Visit

## 2022-04-26 ENCOUNTER — Other Ambulatory Visit (HOSPITAL_COMMUNITY): Payer: Self-pay | Admitting: *Deleted

## 2022-04-26 ENCOUNTER — Encounter (HOSPITAL_COMMUNITY): Payer: Self-pay

## 2022-04-26 MED ORDER — METOPROLOL TARTRATE 100 MG PO TABS: ORAL_TABLET | ORAL | 0 refills | Status: DC

## 2022-04-27 ENCOUNTER — Telehealth (HOSPITAL_COMMUNITY): Payer: Self-pay | Admitting: *Deleted

## 2022-04-27 NOTE — Telephone Encounter (Signed)
Attempted to call patient regarding upcoming cardiac CT appointment. °Left message on voicemail with name and callback number ° °Tatia Petrucci RN Navigator Cardiac Imaging ° [facility] °[phone] Office °[phone] Cell ° °

## 2022-04-27 NOTE — Telephone Encounter (Signed)
Patient returning call about her upcoming cardiac imaging study; pt verbalizes understanding of appt date/time, parking situation and where to check in, pre-test NPO status and medications ordered, and verified current allergies; name and call back number provided for further questions should they arise  [name] RN Navigator Cardiac [facility] [phone] office [phone] cell  Patient to take 16m metoprolol tartrate two hours prior to her cardiac CT scan.

## 2022-04-30 ENCOUNTER — Other Ambulatory Visit: Payer: Self-pay | Admitting: Internal Medicine

## 2022-04-30 ENCOUNTER — Ambulatory Visit
Admission: RE | Admit: 2022-04-30 | Discharge: 2022-04-30 | Disposition: A | Discharge status: Home / Self Care | Payer: Medicare Other | Source: Ambulatory Visit | Attending: Internal Medicine | Admitting: Internal Medicine

## 2022-04-30 DIAGNOSIS — G4730 Sleep apnea, unspecified: Secondary | ICD-10-CM

## 2022-04-30 DIAGNOSIS — R0602 Shortness of breath: Secondary | ICD-10-CM

## 2022-04-30 DIAGNOSIS — I10 Essential (primary) hypertension: Secondary | ICD-10-CM

## 2022-04-30 DIAGNOSIS — Z87891 Personal history of nicotine dependence: Secondary | ICD-10-CM

## 2022-04-30 DIAGNOSIS — R079 Chest pain, unspecified: Secondary | ICD-10-CM

## 2022-04-30 DIAGNOSIS — R0902 Hypoxemia: Secondary | ICD-10-CM

## 2022-04-30 DIAGNOSIS — I509 Heart failure, unspecified: Secondary | ICD-10-CM

## 2022-04-30 DIAGNOSIS — E1110 Type 2 diabetes mellitus with ketoacidosis without coma: Secondary | ICD-10-CM

## 2022-05-04 ENCOUNTER — Telehealth (HOSPITAL_COMMUNITY): Payer: Self-pay | Admitting: Emergency Medicine

## 2022-05-04 DIAGNOSIS — R079 Chest pain, unspecified: Secondary | ICD-10-CM

## 2022-05-04 MED ORDER — METOPROLOL TARTRATE 100 MG PO TABS: 100.0000 mg | ORAL_TABLET | Freq: Once | ORAL | 0 refills | Status: DC

## 2022-05-04 MED ORDER — IVABRADINE HCL 5 MG PO TABS: 15.0000 mg | ORAL_TABLET | Freq: Once | ORAL | 0 refills | Status: AC

## 2022-05-04 NOTE — Telephone Encounter (Signed)
Reaching out to patient to offer assistance regarding upcoming cardiac imaging study; pt verbalizes understanding of appt date/time, parking situation and where to check in, pre-test NPO status and medications ordered, and verified current allergies; name and call back number provided for further questions should they arise Marchia Bond RN Navigator Cardiac Imaging [facility] [phone] office [phone] cell  Arrival [place] Denies iv issues '100mg'$  metoprolol + '15mg'$  ivabradine Aware contrast/nitro

## 2022-05-07 ENCOUNTER — Ambulatory Visit: Admission: RE | Admit: 2022-05-07 | Payer: Medicare Other | Source: Ambulatory Visit

## 2022-05-10 ENCOUNTER — Telehealth (HOSPITAL_COMMUNITY): Payer: Self-pay | Admitting: *Deleted

## 2022-05-10 NOTE — Telephone Encounter (Signed)
Reaching out to patient to offer assistance regarding upcoming cardiac imaging study; pt verbalizes understanding of appt date/time, parking situation and where to check in, pre-test NPO status and medications ordered, and verified current allergies; name and call back number provided for further questions should they arise  Kristie Clement RN Navigator Cardiac Imaging [facility] [phone] office [phone] cell  Patient to take '100mg'$  metoprolol tartrate + '15mg'$  ivabradine two hours prior to her cardiac CT scan.

## 2022-05-14 ENCOUNTER — Ambulatory Visit
Admission: RE | Admit: 2022-05-14 | Discharge: 2022-05-14 | Disposition: A | Discharge status: Home / Self Care | Payer: Medicare Other | Source: Ambulatory Visit | Attending: Internal Medicine | Admitting: Internal Medicine

## 2022-05-14 DIAGNOSIS — R0902 Hypoxemia: Secondary | ICD-10-CM | POA: Diagnosis present

## 2022-05-14 DIAGNOSIS — G4730 Sleep apnea, unspecified: Secondary | ICD-10-CM | POA: Diagnosis present

## 2022-05-14 DIAGNOSIS — R0602 Shortness of breath: Secondary | ICD-10-CM | POA: Insufficient documentation

## 2022-05-14 DIAGNOSIS — I509 Heart failure, unspecified: Secondary | ICD-10-CM | POA: Insufficient documentation

## 2022-05-14 DIAGNOSIS — R079 Chest pain, unspecified: Secondary | ICD-10-CM | POA: Diagnosis present

## 2022-05-14 DIAGNOSIS — Z6841 Body Mass Index (BMI) 40.0 and over, adult: Secondary | ICD-10-CM | POA: Insufficient documentation

## 2022-05-14 DIAGNOSIS — Z87891 Personal history of nicotine dependence: Secondary | ICD-10-CM | POA: Diagnosis present

## 2022-05-14 MED ORDER — METOPROLOL TARTRATE 5 MG/5ML IV SOLN
10.0000 mg | Freq: Once | INTRAVENOUS | Status: AC
  Administered 2022-05-14: 10 mg via INTRAVENOUS

## 2022-05-14 MED ORDER — NITROGLYCERIN 0.4 MG SL SUBL
0.8000 mg | SUBLINGUAL_TABLET | Freq: Once | SUBLINGUAL | Status: AC
  Administered 2022-05-14: 0.8 mg via SUBLINGUAL

## 2022-05-14 MED ORDER — METOPROLOL TARTRATE 5 MG/5ML IV SOLN
INTRAVENOUS | Status: AC
  Filled 2022-05-14: qty 10

## 2022-05-14 MED ORDER — IOHEXOL 350 MG/ML SOLN
100.0000 mL | Freq: Once | INTRAVENOUS | Status: AC | PRN
  Administered 2022-05-14: 100 mL via INTRAVENOUS

## 2022-05-14 NOTE — Progress Notes (Signed)
Patient tolerated procedure well. Ambulate w/o difficulty. Denies any lightheadedness or being dizzy. Pt denies any pain at this time. Sitting in chair, pt is encouraged to drink additional water throughout the day and reason explained to patient. Patient verbalized understanding and all questions answered. ABC intact. No further needs at this time. Discharge from procedure area w/o issues.  

## 2022-05-15 ENCOUNTER — Ambulatory Visit: Payer: Medicare Other | Attending: Family | Admitting: Family

## 2022-05-15 ENCOUNTER — Encounter: Payer: Self-pay | Admitting: Family

## 2022-05-15 VITALS — BP 116/67 | HR 90 | Wt 272.0 lb

## 2022-05-15 DIAGNOSIS — Z720 Tobacco use: Secondary | ICD-10-CM

## 2022-05-15 DIAGNOSIS — F32A Depression, unspecified: Secondary | ICD-10-CM | POA: Insufficient documentation

## 2022-05-15 DIAGNOSIS — R5383 Other fatigue: Secondary | ICD-10-CM | POA: Insufficient documentation

## 2022-05-15 DIAGNOSIS — E119 Type 2 diabetes mellitus without complications: Secondary | ICD-10-CM | POA: Insufficient documentation

## 2022-05-15 DIAGNOSIS — I5032 Chronic diastolic (congestive) heart failure: Secondary | ICD-10-CM

## 2022-05-15 DIAGNOSIS — J449 Chronic obstructive pulmonary disease, unspecified: Secondary | ICD-10-CM | POA: Diagnosis not present

## 2022-05-15 DIAGNOSIS — Z79899 Other long term (current) drug therapy: Secondary | ICD-10-CM | POA: Insufficient documentation

## 2022-05-15 DIAGNOSIS — F17290 Nicotine dependence, other tobacco product, uncomplicated: Secondary | ICD-10-CM | POA: Insufficient documentation

## 2022-05-15 DIAGNOSIS — R0683 Snoring: Secondary | ICD-10-CM | POA: Diagnosis not present

## 2022-05-15 DIAGNOSIS — M79606 Pain in leg, unspecified: Secondary | ICD-10-CM | POA: Insufficient documentation

## 2022-05-15 DIAGNOSIS — I110 Hypertensive heart disease with heart failure: Secondary | ICD-10-CM | POA: Diagnosis not present

## 2022-05-15 DIAGNOSIS — I10 Essential (primary) hypertension: Secondary | ICD-10-CM

## 2022-05-15 DIAGNOSIS — R600 Localized edema: Secondary | ICD-10-CM | POA: Diagnosis not present

## 2022-05-15 DIAGNOSIS — G4733 Obstructive sleep apnea (adult) (pediatric): Secondary | ICD-10-CM | POA: Diagnosis not present

## 2022-05-15 DIAGNOSIS — R0602 Shortness of breath: Secondary | ICD-10-CM | POA: Diagnosis not present

## 2022-05-15 DIAGNOSIS — Z7984 Long term (current) use of oral hypoglycemic drugs: Secondary | ICD-10-CM | POA: Diagnosis not present

## 2022-05-15 NOTE — Progress Notes (Signed)
Patient ID: Kristie Sanchez, female    DOB: January 29, 1956, 67 y.o.   MRN: [id number]  HPI  [name] is a 67 y/o female with a history of DM, HTN, depression, DM, COPD, vaping and chronic heart failure.   Echo report from 02/11/22 showed an EF of 50-55% along with mild/moderate LAE and mild AS.   Cardiac CTA 05/14/22 showed: Aorta: Normal size. Minimal aortic root calcifications. No dissection.  Aortic Valve:  Trileaflet.  No calcifications.  Coronary Arteries:  Normal coronary origin.  Right dominance.  RCA is a dominant artery. There is no plaque.  Left main gives rise to LAD and LCX arteries. LM has no disease.  LAD has no plaque.  LCX is a non-dominant artery.  There is no plaque.  Other findings:  Normal pulmonary vein drainage into the left atrium.  Normal left atrial appendage without a thrombus.  Normal size of the pulmonary artery.  IMPRESSION: 1. Normal coronary calcium score of 0. Patient is low risk.  2. Normal coronary origin with right dominance.  3. No evidence of CAD.  Admitted 02/10/22 due to 1 week of worsening shortness of breath and leg swelling.Received furosemide 20 mg IV bid--change to po lasix 40 mg qd. Discharged after 2 days.   She presents today for a HF follow-up visit with a chief complaint of moderate fatigue with exertion. She describes this as chronic although feels like it's much better than it was. She has associated cough, SOB, pedal edema ("much better") and chronic difficulty sleeping. Denies any dizziness, abdominal distention, palpitations, chest pain, wheezing or weight gain.   Has increased her activity and will be starting at the [facility] next week and plans to do water aerobics. She is also closely watching what she eats and is "pushing herself away from the table".   Is picking up her CPAP equipment today as she says that she "failed miserably" the sleep study.  Past Medical History:  Diagnosis Date   CHF (congestive heart failure) (HCC)    COPD  (chronic obstructive pulmonary disease) (HCC)    Depression    Diabetes mellitus without complication ([place])    Hypertension    No past surgical history on file. Family History  Problem Relation Age of Onset   Lymphoma Mother 6   Lymphoma Maternal Uncle    Lymphoma Maternal Aunt    Breast cancer Neg Hx    Social History   Tobacco Use   Smoking status: Former    Packs/day: 1.00    Years: 35.00    Total pack years: 35.00    Types: Cigarettes   Smokeless tobacco: Former    Quit date: 03/23/2011  Substance Use Topics   Alcohol use: No    Alcohol/week: 0.0 standard drinks of alcohol   Allergies  Allergen Reactions   Sulfa Antibiotics Swelling   Prior to Admission medications   Medication Sig Start Date End Date Taking? Authorizing Provider  blood glucose meter kit and supplies KIT Dispense based on patient and insurance preference. Use up to four times daily as directed. 02/12/22  Yes Fritzi Mandes, MD  dapagliflozin propanediol (FARXIGA) 10 MG TABS tablet Take 1 tablet (10 mg total) by mouth daily before breakfast. 02/19/22  Yes Darylene Price A, FNP  furosemide (LASIX) 20 MG tablet Take 20 mg by mouth daily.   Yes [provider]  lisinopril (ZESTRIL) 10 MG tablet Take 10 mg by mouth daily.   Yes [provider]  metFORMIN (GLUCOPHAGE-XR) 500 MG  24 hr tablet Take 500 mg by mouth daily with breakfast. 02/16/22  Yes [provider]  venlafaxine XR (EFFEXOR-XR) 150 MG 24 hr capsule Take 150 mg by mouth daily with breakfast.   Yes [provider]  Ipratropium-Albuterol (COMBIVENT) 20-100 MCG/ACT AERS respimat Inhale 1 puff into the lungs every 6 (six) hours as needed for wheezing. 02/12/22   Fritzi Mandes, MD  ipratropium-albuterol (DUONEB) 0.5-2.5 (3) MG/3ML SOLN Take 3 mLs by nebulization every 4 (four) hours as needed. Patient not taking: Reported on 02/19/2022 02/12/22   Fritzi Mandes, MD  mometasone-formoterol [facility]) 100-5 MCG/ACT AERO Inhale 2 puffs  into the lungs 2 (two) times daily. 02/12/22   Fritzi Mandes, MD    Review of Systems  Constitutional:  Positive for fatigue (improving). Negative for appetite change.  HENT:  Negative for congestion, postnasal drip and sore throat.   Eyes: Negative.   Respiratory:  Positive for cough (productive) and shortness of breath (with moderate exertion). Negative for chest tightness and wheezing.   Cardiovascular:  Positive for leg swelling (improving). Negative for chest pain and palpitations.  Gastrointestinal:  Negative for abdominal distention and abdominal pain.  Endocrine: Negative.   Genitourinary: Negative.   Musculoskeletal:  Positive for arthralgias (leg pain).  Skin: Negative.   Allergic/Immunologic: Negative.   Neurological:  Negative for dizziness and light-headedness.  Hematological:  Negative for adenopathy. Does not bruise/bleed easily.  Psychiatric/Behavioral:  Positive for sleep disturbance (sleeping on 2 pillows; + snoring). Negative for dysphoric mood. The patient is not nervous/anxious.    Vitals:   05/15/22 1024  BP: 116/67  Pulse: 90  SpO2: 93%  Weight: 272 lb (123.4 kg)   Wt Readings from Last 3 Encounters:  05/15/22 272 lb (123.4 kg)  03/13/22 294 lb 4 oz (133.5 kg)  02/19/22 297 lb (134.7 kg)   Lab Results  Component Value Date   CREATININE 0.72 03/13/2022   CREATININE 0.73 02/11/2022   CREATININE 0.66 02/10/2022   Physical Exam Vitals and nursing note reviewed. Exam conducted with a chaperone present (son).  Constitutional:      Appearance: Normal appearance.  HENT:     Head: Normocephalic and atraumatic.  Cardiovascular:     Rate and Rhythm: Normal rate and regular rhythm.  Pulmonary:     Effort: Pulmonary effort is normal.     Breath sounds: No wheezing, rhonchi or rales.  Abdominal:     General: Abdomen is flat. There is no distension.     Palpations: Abdomen is soft.     Tenderness: There is no abdominal tenderness.  Musculoskeletal:         General: No tenderness.     Cervical back: Normal range of motion and neck supple.     Right lower leg: Edema (trace pitting) present.     Left lower leg: Edema (trace pitting) present.  Skin:    General: Skin is warm and dry.  Neurological:     General: No focal deficit present.     Mental Status: She is alert and oriented to person, place, and time.  Psychiatric:        Mood and Affect: Mood normal.        Behavior: Behavior normal.        Thought Content: Thought content normal.    Assessment & Plan:  1: Chronic heart failure with preserved ejection fraction with structural changes (LAE)- - NYHA class II - euvolemic today - weighing daily; reminded to call for an overnight weight gain of >  2 pounds or a weekly weight gain of > 5 pounds - weight down 22 pounds from last visit here 2 months ago - is increasing her activity and will be starting at the [facility] more consistently and is also not eating as much as she used to; says that she has lost ~ 40 pounds since 02/2022 - echo 02/11/22: EF of 50-55% along with mild/moderate LAE and mild AS.  - cardiac CTA 05/14/22: Aorta: Normal size. Minimal aortic root calcifications. No dissection. Aortic Valve:  Trileaflet.  No calcifications. Coronary Arteries:  Normal coronary origin.  Right dominance. RCA is a dominant artery. There is no plaque. Left main gives rise to LAD and LCX arteries. LM has no disease. LAD has no plaque. LCX is a non-dominant artery.  There is no plaque. Other findings: Normal pulmonary vein drainage into the left atrium. Normal left atrial appendage without a thrombus. Normal size of the pulmonary artery. IMPRESSION:1. Normal coronary calcium score of 0. Patient is low risk. 2. Normal coronary origin with right dominance. 3. No evidence of CAD. - lisinopril '10mg'$  daily - farxiga '10mg'$  '10mg'$  daily - furosemide '20mg'$  daily - saw cardiology Clayborn Bigness) 04/05/22 - not adding salt and has been reading food labels for sodium content -  wearing compression socks daily with removal at bedtime - BNP 02/10/22 was 49.8  2: HTN- - BP 116/67 - saw PCP Baldemar Lenis) 05/01/22 - BMP 04/23/22 showed sodium 139, potassium 4.5, creatinine 0.6 & GFR 98  3: DM- - metformin XR '500mg'$  daily - A1c 04/23/22 was 6.4%  4: Tobacco use- - has resumed vaping but has a stop date of 05/31/22  5: OSA-  - had sleep study 04/09/22 - saw pulmonology Raul Del) 04/25/22 - will start wearing auto-CPAP 5-20 cm H20 with 2 L O2 as she is picking up equipment today   Medication bottles reviewed.   Due to HF stability, will not make a return appointment at this time. Advised her that she could return at anytime for questions or if she needed to make an appointment and she was comfortable with this plan.

## 2022-05-15 NOTE — Patient Instructions (Signed)
Call us in the future if you need [place] for anything

## 2022-07-24 ENCOUNTER — Other Ambulatory Visit (HOSPITAL_COMMUNITY): Payer: Self-pay

## 2023-05-06 ENCOUNTER — Other Ambulatory Visit: Payer: Self-pay | Admitting: Family Medicine

## 2023-05-06 DIAGNOSIS — Z1231 Encounter for screening mammogram for malignant neoplasm of breast: Secondary | ICD-10-CM

## 2023-05-11 LAB — COLOGUARD: COLOGUARD: NEGATIVE

## 2023-05-11 LAB — EXTERNAL GENERIC LAB PROCEDURE: COLOGUARD: NEGATIVE

## 2023-05-21 ENCOUNTER — Ambulatory Visit
Admission: RE | Admit: 2023-05-21 | Discharge: 2023-05-21 | Disposition: A | Discharge status: Home / Self Care | Payer: 59 | Source: Ambulatory Visit | Attending: Family Medicine | Admitting: Family Medicine

## 2023-05-21 DIAGNOSIS — Z1231 Encounter for screening mammogram for malignant neoplasm of breast: Secondary | ICD-10-CM | POA: Insufficient documentation
